# Patient Record
Sex: Female | Born: 1995 | Race: White | Hispanic: No | Marital: Single | Smoking: Never smoker
Health system: Southern US, Community
[De-identification: ages and names within clinical notes are randomized; demographics above are authoritative.]

## PROBLEM LIST (undated history)

## (undated) ENCOUNTER — Inpatient Hospital Stay (HOSPITAL_COMMUNITY): Payer: Self-pay

## (undated) ENCOUNTER — Inpatient Hospital Stay: Payer: Self-pay

## (undated) DIAGNOSIS — O139 Gestational [pregnancy-induced] hypertension without significant proteinuria, unspecified trimester: Secondary | ICD-10-CM

## (undated) DIAGNOSIS — J45909 Unspecified asthma, uncomplicated: Secondary | ICD-10-CM

## (undated) HISTORY — DX: Gestational (pregnancy-induced) hypertension without significant proteinuria, unspecified trimester: O13.9

## (undated) HISTORY — PX: JOINT REPLACEMENT: SHX530

---
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
## 2016-03-14 NOTE — L&D Delivery Note (Signed)
Patient is a 21 y.o. now G2P2 s/p NSVD at 6025w1d, who was admitted for IOL for gHTN.  Delivery Note At 12:18 PM a viable female was delivered via Vaginal, Spontaneous Delivery (Presentation: Cephalic ; ROA ).  APGAR: 8, 9; weight 7 lb 10.2 oz (3464 g).   Placenta status: intact, .  Cord: 3 vessel  with the following complications:none .    Anesthesia: epidural  Episiotomy: None Lacerations: None Suture Repair:  Est. Blood Loss (mL): 100  Mom to postpartum.  Baby to Couplet care / Skin to Skin.  Head delivered OA. No nuchal cord present. Shoulder and body delivered in usual fashion. Infant with spontaneous cry, placed on mother's abdomen, dried and bulb suctioned. Cord clamped x 2 after 1-minute delay, and cut by family member. Cord blood drawn. Placenta delivered spontaneously with gentle cord traction. Fundus firm with massage and Pitocin. Perineum inspected and found to have no laceration. Teodoro KilKerrriann S. Caylor Cerino,  MD Family Medicine Resident PGY-1 01/12/17, 3:09 PM

## 2016-03-29 ENCOUNTER — Encounter: Payer: Self-pay | Admitting: Emergency Medicine

## 2016-03-29 ENCOUNTER — Emergency Department
Admission: EM | Admit: 2016-03-29 | Discharge: 2016-03-29 | Disposition: A | Discharge status: Home / Self Care | Payer: Medicaid Other | Attending: Emergency Medicine | Admitting: Emergency Medicine

## 2016-03-29 DIAGNOSIS — Y939 Activity, unspecified: Secondary | ICD-10-CM | POA: Insufficient documentation

## 2016-03-29 DIAGNOSIS — J45909 Unspecified asthma, uncomplicated: Secondary | ICD-10-CM | POA: Insufficient documentation

## 2016-03-29 DIAGNOSIS — Z5321 Procedure and treatment not carried out due to patient leaving prior to being seen by health care provider: Secondary | ICD-10-CM | POA: Insufficient documentation

## 2016-03-29 DIAGNOSIS — Y929 Unspecified place or not applicable: Secondary | ICD-10-CM | POA: Diagnosis not present

## 2016-03-29 DIAGNOSIS — N939 Abnormal uterine and vaginal bleeding, unspecified: Secondary | ICD-10-CM | POA: Insufficient documentation

## 2016-03-29 DIAGNOSIS — Y999 Unspecified external cause status: Secondary | ICD-10-CM | POA: Insufficient documentation

## 2016-03-29 LAB — COMPREHENSIVE METABOLIC PANEL
ALT: 22 U/L (ref 14–54)
AST: 24 U/L (ref 15–41)
Albumin: 3.9 g/dL (ref 3.5–5.0)
Alkaline Phosphatase: 61 U/L (ref 38–126)
Anion gap: 5 (ref 5–15)
BUN: 11 mg/dL (ref 6–20)
CALCIUM: 9.4 mg/dL (ref 8.9–10.3)
CO2: 26 mmol/L (ref 22–32)
CREATININE: 0.52 mg/dL (ref 0.44–1.00)
Chloride: 105 mmol/L (ref 101–111)
GLUCOSE: 85 mg/dL (ref 65–99)
Potassium: 4.3 mmol/L (ref 3.5–5.1)
SODIUM: 136 mmol/L (ref 135–145)
TOTAL PROTEIN: 7.7 g/dL (ref 6.5–8.1)
Total Bilirubin: 0.3 mg/dL (ref 0.3–1.2)

## 2016-03-29 LAB — CBC
HCT: 38.8 % (ref 35.0–47.0)
Hemoglobin: 13.3 g/dL (ref 12.0–16.0)
MCH: 30.2 pg (ref 26.0–34.0)
MCHC: 34.4 g/dL (ref 32.0–36.0)
MCV: 87.8 fL (ref 80.0–100.0)
PLATELETS: 317 10*3/uL (ref 150–440)
RBC: 4.42 MIL/uL (ref 3.80–5.20)
RDW: 13.2 % (ref 11.5–14.5)
WBC: 9.8 10*3/uL (ref 3.6–11.0)

## 2016-03-29 NOTE — ED Notes (Signed)
Pt not in lobby when called to room.  
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
## 2016-05-04 DIAGNOSIS — E7800 Pure hypercholesterolemia, unspecified: Secondary | ICD-10-CM | POA: Insufficient documentation

## 2016-05-04 DIAGNOSIS — E669 Obesity, unspecified: Secondary | ICD-10-CM | POA: Insufficient documentation

## 2016-06-04 ENCOUNTER — Emergency Department
Admission: EM | Admit: 2016-06-04 | Discharge: 2016-06-05 | Disposition: A | Discharge status: Home / Self Care | Payer: Medicaid Other | Attending: Emergency Medicine | Admitting: Emergency Medicine

## 2016-06-04 ENCOUNTER — Emergency Department: Payer: Medicaid Other

## 2016-06-04 DIAGNOSIS — O200 Threatened abortion: Secondary | ICD-10-CM | POA: Diagnosis not present

## 2016-06-04 DIAGNOSIS — J45909 Unspecified asthma, uncomplicated: Secondary | ICD-10-CM | POA: Insufficient documentation

## 2016-06-04 DIAGNOSIS — O209 Hemorrhage in early pregnancy, unspecified: Secondary | ICD-10-CM | POA: Diagnosis present

## 2016-06-04 DIAGNOSIS — Z3A01 Less than 8 weeks gestation of pregnancy: Secondary | ICD-10-CM | POA: Diagnosis not present

## 2016-06-04 DIAGNOSIS — Z79899 Other long term (current) drug therapy: Secondary | ICD-10-CM | POA: Insufficient documentation

## 2016-06-04 LAB — BASIC METABOLIC PANEL
ANION GAP: 6 (ref 5–15)
BUN: 10 mg/dL (ref 6–20)
CALCIUM: 9.1 mg/dL (ref 8.9–10.3)
CHLORIDE: 101 mmol/L (ref 101–111)
CO2: 25 mmol/L (ref 22–32)
Creatinine, Ser: 0.57 mg/dL (ref 0.44–1.00)
GFR calc non Af Amer: >60 mL/min (ref >60)
GLUCOSE: 109 mg/dL — AB (ref 65–99)
Potassium: 3.8 mmol/L (ref 3.5–5.1)
Sodium: 132 mmol/L — ABNORMAL LOW (ref 135–145)

## 2016-06-04 LAB — HCG, QUANTITATIVE, PREGNANCY: HCG, BETA CHAIN, QUANT, S: 34122 m[IU]/mL — AB (ref <5)

## 2016-06-04 LAB — CBC
HCT: 37.7 % (ref 35.0–47.0)
HEMOGLOBIN: 13 g/dL (ref 12.0–16.0)
MCH: 30.3 pg (ref 26.0–34.0)
MCHC: 34.6 g/dL (ref 32.0–36.0)
MCV: 87.4 fL (ref 80.0–100.0)
Platelets: 321 10*3/uL (ref 150–440)
RBC: 4.31 MIL/uL (ref 3.80–5.20)
RDW: 13.1 % (ref 11.5–14.5)
WBC: 11.4 10*3/uL — ABNORMAL HIGH (ref 3.6–11.0)

## 2016-06-04 LAB — ABO/RH: ABO/RH(D): O POS

## 2016-06-04 LAB — POCT PREGNANCY, URINE: Preg Test, Ur: POSITIVE — AB

## 2016-06-04 NOTE — ED Notes (Signed)
In to see pt for assessment. Pt is in US at this time.

## 2016-06-04 NOTE — ED Notes (Signed)
Pt was given a gown and asked to remove under ware and pants for pelvic. Pt currently getting changed. RN notified of pt placement in room. Blood work and urine sample was obtained in Triage by this ED Tech. POCT RESULTS WERE *POSITIVE*

## 2016-06-04 NOTE — ED Provider Notes (Signed)
[facility] Emergency Department Provider Note   ____________________________________________   First MD Initiated Contact with Patient 06/04/16 2316     (approximate)  I have reviewed the triage vital signs and the nursing notes.   HISTORY  Chief Complaint Vaginal Bleeding (positive home pregnancy test)    HPI Breanna Carrillo is a 21 y.o. female G2 P1 approximately [redacted] weeks pregnant by dates who presents with vaginal bleeding and pelvic cramping. Reports positive pregnancy test at home. Reports vaginal spotting 2 days ago. Last sexual intercourse in February. Denies STD concerns. Denies fever, chills, chest pain, shortness of breath, nausea, vomiting, dysuria.Denies recent travel or trauma. Nothing makes her symptoms better or worse.   Past Medical History:  Diagnosis Date  . Asthma     There are no active problems to display for this patient.   Past Surgical History:  Procedure Laterality Date  . JOINT REPLACEMENT      Prior to Admission medications   Medication Sig Start Date End Date Taking? Authorizing Provider  cephALEXin (KEFLEX) 500 MG capsule Take 1 capsule (500 mg total) by mouth 2 (two) times daily. 10/20/15   Governor Rooksebecca Lord, MD  ibuprofen (ADVIL,MOTRIN) 800 MG tablet Take 1 tablet (800 mg total) by mouth every 8 (eight) hours as needed. 10/20/15   Governor Rooksebecca Lord, MD  ondansetron (ZOFRAN) 4 MG tablet Take 1 tablet (4 mg total) by mouth every 8 (eight) hours as needed for nausea or vomiting. 10/20/15   Governor Rooksebecca Lord, MD    Allergies Patient has no known allergies.  Family History  Problem Relation Age of Onset  . Hypertension Father     Social History Social History  Substance Use Topics  . Smoking status: Never Smoker  . Smokeless tobacco: Never Used  . Alcohol use No    Review of Systems  Constitutional: No fever/chills. Eyes: No visual changes. ENT: No sore throat. Cardiovascular: Denies chest pain. Respiratory: Denies  shortness of breath. Gastrointestinal: Positive for pelvic cramping. No abdominal pain.  No nausea, no vomiting.  No diarrhea.  No constipation. Genitourinary: Positive for vaginal bleeding. Negative for dysuria. Musculoskeletal: Negative for back pain. Skin: Negative for rash. Neurological: Negative for headaches, focal weakness or numbness.  10-point ROS otherwise negative.  ____________________________________________   PHYSICAL EXAM:  VITAL SIGNS: ED Triage Vitals  Enc Vitals Group     BP 06/04/16 2158 135/67     Pulse Rate 06/04/16 2158 (!) 105     Resp 06/04/16 2158 19     Temp 06/04/16 2158 97.8 F (36.6 C)     Temp Source 06/04/16 2158 Oral     SpO2 06/04/16 2158 99 %     Weight 06/04/16 2159 180 lb (81.6 kg)     Height 06/04/16 2159 5\' 7"  (1.702 m)     Head Circumference --      Peak Flow --      Pain Score 06/04/16 2159 7     Pain Loc --      Pain Edu? --      Excl. in GC? --     Constitutional: Alert and oriented. Well appearing and in no acute distress. Eyes: Conjunctivae are normal. PERRL. EOMI. Head: Atraumatic. Nose: No congestion/rhinnorhea. Mouth/Throat: Mucous membranes are moist.  Oropharynx non-erythematous. Neck: No stridor.   Cardiovascular: Normal rate, regular rhythm. Grossly normal heart sounds.  Good peripheral circulation. Respiratory: Normal respiratory effort.  No retractions. Lungs CTAB. Gastrointestinal: Soft and nontender to light or deep palpation. No  distention. No abdominal bruits. No CVA tenderness. Musculoskeletal: No lower extremity tenderness nor edema.  No joint effusions. Neurologic:  Normal speech and language. No gross focal neurologic deficits are appreciated. No gait instability. Skin:  Skin is warm, dry and intact. No rash noted. Psychiatric: Mood and affect are normal. Speech and behavior are normal.  ____________________________________________   LABS (all labs ordered are listed, but only abnormal results are  displayed)  Labs Reviewed  HCG, QUANTITATIVE, PREGNANCY - Abnormal; Notable for the following:       Result Value   hCG, Beta Chain, Quant, S 34,122 (*)    All other components within normal limits  CBC - Abnormal; Notable for the following:    WBC 11.4 (*)    All other components within normal limits  BASIC METABOLIC PANEL - Abnormal; Notable for the following:    Sodium 132 (*)    Glucose, Bld 109 (*)    All other components within normal limits  POCT PREGNANCY, URINE - Abnormal; Notable for the following:    Preg Test, Ur POSITIVE (*)    All other components within normal limits  POC URINE PREG, ED  ABO/RH   ____________________________________________  EKG  None ____________________________________________  RADIOLOGY  OB ultrasound interpreted per Dr. Manson Passey: 1. Single living intrauterine embryo.  2. Size by ultrasound correlates well with dating by LMP.  3. Today's exam confirms clinical EDC of 02/01/2017.   ____________________________________________   PROCEDURES  Procedure(s) performed:   Pelvic exam: External exam WNL without rashes, lesions or vesicles. Speculum exam reveals no vaginal bleeding, cervical os closed. Bimanual exam WNL.  :Procedures  Critical Care performed: No  ____________________________________________   INITIAL IMPRESSION / ASSESSMENT AND PLAN / ED COURSE  Pertinent labs & imaging results that were available during my care of the patient were reviewed by me and considered in my medical decision making (see chart for details).  21 year old female G2 P1 approximately [redacted] weeks pregnant without vaginal bleeding on exam. Ultrasound reveals IUP. Patient delivered with [facility] with her first child and wishes to return for prenatal care. Strict return precautions given. Patient verbalizes understanding and agrees with plan of care.      ____________________________________________   FINAL CLINICAL IMPRESSION(S) / ED  DIAGNOSES  Final diagnoses:  Threatened miscarriage      NEW MEDICATIONS STARTED DURING THIS VISIT:  New Prescriptions   No medications on file     Note:  This document was prepared using Dragon voice recognition software and may include unintentional dictation errors.    Irean Hong, MD 06/05/16 [phone]

## 2016-06-04 NOTE — ED Triage Notes (Signed)
Patient c/o vaginal bleeding, abdominal cramping beginning today. Patient reports she had a positive pregnancy test at home. Pt reports last menstrual period 04/27/16.

## 2016-06-05 NOTE — Discharge Instructions (Signed)
1. Drink plenty of fluids daily. °2. Pelvic rest until seen by your doctor - no sexual intercourse, douche, tampons. °3. Return to the ER for worsening symptoms, persistent vomiting, fainting, soaking more than 1 pad per hour or other concerns.  °

## 2016-06-08 LAB — PREGNANCY, URINE: Preg Test, Ur: POSITIVE

## 2016-06-21 ENCOUNTER — Telehealth: Payer: Self-pay | Admitting: Obstetrics & Gynecology

## 2016-06-21 NOTE — Telephone Encounter (Signed)
Called and Lvm With [facility] referral Line that at this time we are on hold for new Medicaid Pt.

## 2016-06-22 ENCOUNTER — Emergency Department
Admission: EM | Admit: 2016-06-22 | Discharge: 2016-06-22 | Disposition: A | Discharge status: Home / Self Care | Payer: Medicaid Other | Attending: Emergency Medicine | Admitting: Emergency Medicine

## 2016-06-22 ENCOUNTER — Emergency Department: Payer: Medicaid Other

## 2016-06-22 DIAGNOSIS — J45909 Unspecified asthma, uncomplicated: Secondary | ICD-10-CM | POA: Insufficient documentation

## 2016-06-22 DIAGNOSIS — R1032 Left lower quadrant pain: Secondary | ICD-10-CM | POA: Insufficient documentation

## 2016-06-22 DIAGNOSIS — O219 Vomiting of pregnancy, unspecified: Secondary | ICD-10-CM | POA: Diagnosis not present

## 2016-06-22 DIAGNOSIS — O26891 Other specified pregnancy related conditions, first trimester: Secondary | ICD-10-CM | POA: Diagnosis present

## 2016-06-22 DIAGNOSIS — Z3A08 8 weeks gestation of pregnancy: Secondary | ICD-10-CM | POA: Insufficient documentation

## 2016-06-22 DIAGNOSIS — O0281 Inappropriate change in quantitative human chorionic gonadotropin (hCG) in early pregnancy: Secondary | ICD-10-CM | POA: Diagnosis not present

## 2016-06-22 LAB — URINALYSIS, COMPLETE (UACMP) WITH MICROSCOPIC
Bilirubin Urine: NEGATIVE
Glucose, UA: NEGATIVE mg/dL
HGB URINE DIPSTICK: NEGATIVE
Ketones, ur: NEGATIVE mg/dL
Nitrite: NEGATIVE
PROTEIN: NEGATIVE mg/dL
SPECIFIC GRAVITY, URINE: 1.024 (ref 1.005–1.030)
pH: 6 (ref 5.0–8.0)

## 2016-06-22 LAB — COMPREHENSIVE METABOLIC PANEL
ALBUMIN: 3.5 g/dL (ref 3.5–5.0)
ALK PHOS: 78 U/L (ref 38–126)
ALT: 14 U/L (ref 14–54)
ANION GAP: 6 (ref 5–15)
AST: 20 U/L (ref 15–41)
BILIRUBIN TOTAL: 0.4 mg/dL (ref 0.3–1.2)
BUN: 7 mg/dL (ref 6–20)
CO2: 25 mmol/L (ref 22–32)
Calcium: 8.9 mg/dL (ref 8.9–10.3)
Chloride: 102 mmol/L (ref 101–111)
Creatinine, Ser: 0.54 mg/dL (ref 0.44–1.00)
GFR calc Af Amer: >60 mL/min (ref >60)
GFR calc non Af Amer: >60 mL/min (ref >60)
GLUCOSE: 107 mg/dL — AB (ref 65–99)
POTASSIUM: 3.8 mmol/L (ref 3.5–5.1)
Sodium: 133 mmol/L — ABNORMAL LOW (ref 135–145)
Total Protein: 7.3 g/dL (ref 6.5–8.1)

## 2016-06-22 LAB — LIPASE, BLOOD: Lipase: 24 U/L (ref 11–51)

## 2016-06-22 LAB — CBC
HEMATOCRIT: 38.3 % (ref 35.0–47.0)
HEMOGLOBIN: 13.4 g/dL (ref 12.0–16.0)
MCH: 31 pg (ref 26.0–34.0)
MCHC: 35 g/dL (ref 32.0–36.0)
MCV: 88.4 fL (ref 80.0–100.0)
Platelets: 304 10*3/uL (ref 150–440)
RBC: 4.34 MIL/uL (ref 3.80–5.20)
RDW: 13 % (ref 11.5–14.5)
WBC: 8.7 10*3/uL (ref 3.6–11.0)

## 2016-06-22 LAB — HCG, QUANTITATIVE, PREGNANCY: HCG, BETA CHAIN, QUANT, S: 134246 m[IU]/mL — AB (ref <5)

## 2016-06-22 MED ORDER — SODIUM CHLORIDE 0.9 % IV BOLUS (SEPSIS)
1000.0000 mL | Freq: Once | INTRAVENOUS | Status: AC
  Administered 2016-06-22: 1000 mL via INTRAVENOUS

## 2016-06-22 MED ORDER — METOCLOPRAMIDE HCL 10 MG PO TABS: 10.0000 mg | ORAL_TABLET | Freq: Four times a day (QID) | ORAL | 0 refills | Status: DC | PRN

## 2016-06-22 MED ORDER — FAMOTIDINE 20 MG PO TABS: 20.0000 mg | ORAL_TABLET | Freq: Two times a day (BID) | ORAL | 0 refills | Status: DC

## 2016-06-22 MED ORDER — METOCLOPRAMIDE HCL 5 MG/ML IJ SOLN
10.0000 mg | Freq: Once | INTRAMUSCULAR | Status: AC
Start: 2016-06-22 — End: 2016-06-22
  Administered 2016-06-22: 10 mg via INTRAVENOUS
  Filled 2016-06-22: qty 2

## 2016-06-22 MED ORDER — DIPHENHYDRAMINE HCL 50 MG/ML IJ SOLN
25.0000 mg | Freq: Once | INTRAMUSCULAR | Status: AC
Start: 2016-06-22 — End: 2016-06-22
  Administered 2016-06-22: 25 mg via INTRAVENOUS
  Filled 2016-06-22: qty 1

## 2016-06-22 NOTE — ED Notes (Signed)
Pt given crackers, peanut butter and sprite. Visitor given sprite as well.

## 2016-06-22 NOTE — ED Provider Notes (Signed)
[facility] Emergency Department Provider Note  ____________________________________________  Time seen: Approximately 9:11 PM  I have reviewed the triage vital signs and the nursing notes.   HISTORY  Chief Complaint Abdominal Pain and Dizziness    HPI Breanna Carrillo is a 21 y.o. female who complains of dizziness as well as left lower quadrant abdominal pain for the past several days. She is about [redacted] weeks pregnant, recently had an ultrasound about 2-3 weeks ago which revealed a IUP. Denies any vaginal bleeding but does have cramping pain in the left lower quadrant which is nonradiating and without any aggravating or alleviating factors.  Plans to follow up with [facility] in [place] for prenatal care but has not yet had an appointment. States she is taking prenatal vitamins.  No dysuria frequency urgency or hematuria   Past Medical History:  Diagnosis Date  . Asthma      There are no active problems to display for this patient.    Past Surgical History:  Procedure Laterality Date  . JOINT REPLACEMENT       Prior to Admission medications   Medication Sig Start Date End Date Taking? Authorizing Provider  cephALEXin (KEFLEX) 500 MG capsule Take 1 capsule (500 mg total) by mouth 2 (two) times daily. 10/20/15   Governor Rooks, MD  famotidine (PEPCID) 20 MG tablet Take 1 tablet (20 mg total) by mouth 2 (two) times daily. 06/22/16   Sharman Cheek, MD  ibuprofen (ADVIL,MOTRIN) 800 MG tablet Take 1 tablet (800 mg total) by mouth every 8 (eight) hours as needed. 10/20/15   Governor Rooks, MD  metoCLOPramide (REGLAN) 10 MG tablet Take 1 tablet (10 mg total) by mouth every 6 (six) hours as needed. 06/22/16   Sharman Cheek, MD  ondansetron (ZOFRAN) 4 MG tablet Take 1 tablet (4 mg total) by mouth every 8 (eight) hours as needed for nausea or vomiting. 10/20/15   Governor Rooks, MD     Allergies Patient has no known allergies.   Family History  Problem  Relation Age of Onset  . Hypertension Father     Social History Social History  Substance Use Topics  . Smoking status: Never Smoker  . Smokeless tobacco: Never Used  . Alcohol use No    Review of Systems  Constitutional:   No fever or chills.  ENT:   No sore throat. No rhinorrhea. Cardiovascular:   No chest pain. Respiratory:   No dyspnea or cough. Gastrointestinal:   Positive as above for abdominal pain without, vomiting and diarrhea.  Genitourinary:   Negative for dysuria or difficulty urinating. Musculoskeletal:   Negative for focal pain or swelling Neurological:   Negative for headaches 10-point ROS otherwise negative.  ____________________________________________   PHYSICAL EXAM:  VITAL SIGNS: ED Triage Vitals  Enc Vitals Group     BP 06/22/16 1535 118/67     Pulse Rate 06/22/16 1535 94     Resp 06/22/16 1535 18     Temp 06/22/16 1535 97.7 F (36.5 C)     Temp Source 06/22/16 1535 Oral     SpO2 06/22/16 1535 100 %     Weight --      Height --      Head Circumference --      Peak Flow --      Pain Score 06/22/16 1537 8     Pain Loc --      Pain Edu? --      Excl. in GC? --  Vital signs reviewed, nursing assessments reviewed.   Constitutional:   Alert and oriented. Well appearing and in no distress. Eyes:   No scleral icterus. No conjunctival pallor. PERRL. EOMI.  No nystagmus. ENT   Head:   Normocephalic and atraumatic.   Nose:   No congestion/rhinnorhea. No septal hematoma   Mouth/Throat:   MMM, no pharyngeal erythema. No peritonsillar mass.    Neck:   No stridor. No SubQ emphysema. No meningismus. Hematological/Lymphatic/Immunilogical:   No cervical lymphadenopathy. Cardiovascular:   RRR. Symmetric bilateral radial and DP pulses.  No murmurs.  Respiratory:   Normal respiratory effort without tachypnea nor retractions. Breath sounds are clear and equal bilaterally. No wheezes/rales/rhonchi. Gastrointestinal:   Soft With left lower  quadrant tenderness. Non distended. There is no CVA tenderness.  No rebound, rigidity, or guarding. Genitourinary:   deferred Musculoskeletal:   Normal range of motion in all extremities. No joint effusions.  No lower extremity tenderness.  No edema. Neurologic:   Normal speech and language.  CN 2-10 normal. Motor grossly intact. No gross focal neurologic deficits are appreciated.  Skin:    Skin is warm, dry and intact. No rash noted.  No petechiae, purpura, or bullae.  ____________________________________________    LABS (pertinent positives/negatives) (all labs ordered are listed, but only abnormal results are displayed) Labs Reviewed  COMPREHENSIVE METABOLIC PANEL - Abnormal; Notable for the following:       Result Value   Sodium 133 (*)    Glucose, Bld 107 (*)    All other components within normal limits  URINALYSIS, COMPLETE (UACMP) WITH MICROSCOPIC - Abnormal; Notable for the following:    Color, Urine YELLOW (*)    APPearance CLOUDY (*)    Leukocytes, UA LARGE (*)    Bacteria, UA RARE (*)    Squamous Epithelial / LPF TOO NUMEROUS TO COUNT (*)    All other components within normal limits  HCG, QUANTITATIVE, PREGNANCY - Abnormal; Notable for the following:    hCG, Beta Chain, Quant, S 134,246 (*)    All other components within normal limits  URINE CULTURE  LIPASE, BLOOD  CBC   ____________________________________________   EKG    ____________________________________________    RADIOLOGY  US Ob Comp Less 14 Wks  Result Date: 06/22/2016 CLINICAL DATA:  Initial evaluation for acute left lower quadrant abdominal pain for 5 days. Currently pregnant. Beta HCG not available at time of exam interpretation. EXAM: OBSTETRIC <14 WK [place] AND TRANSVAGINAL OB US DOPPLER ULTRASOUND OF OVARIES TECHNIQUE: Both transabdominal and transvaginal ultrasound examinations were performed for complete evaluation of the gestation as well as the maternal uterus, adnexal regions, and pelvic  cul-de-sac. Transvaginal technique was performed to assess early pregnancy. Color and duplex Doppler ultrasound was utilized to evaluate blood flow to the ovaries. COMPARISON:  Prior ultrasound from 06/04/2013. FINDINGS: Intrauterine gestational sac: Single Yolk sac:  Present Embryo:  Present Cardiac Activity: Present Heart Rate: 180 bpm CRL:   19.3 mm   8 w 3 d                  [place] EDC: 01/29/2017 Subchorionic hemorrhage: Small subchorionic hemorrhage measuring 1.0 x 1.1 x 1.0 cm without mass effect. Maternal uterus/adnexae: The ovaries are well-visualized in the adnexa bilaterally, both normal in appearance. Small approximate 1 cm left corpus luteum cyst noted. No free fluid. Pulsed Doppler evaluation of both ovaries demonstrates normal appearing low-resistance arterial and venous waveforms. IMPRESSION: 1. Single live intrauterine embryo as above. Size and appearance consistent with  expected interval growth as compared to prior ultrasound from 06/04/2016. 2. Small subchorionic hemorrhage without mass effect. 3. No evidence for ovarian torsion. 4. Small approximate 1 cm left corpus luteum cyst.  No free fluid. Electronically Signed   By: Rise Mu M.D.   On: 06/22/2016 20:30   US Ob Transvaginal  Result Date: 06/22/2016 CLINICAL DATA:  Initial evaluation for acute left lower quadrant abdominal pain for 5 days. Currently pregnant. Beta HCG not available at time of exam interpretation. EXAM: OBSTETRIC <14 WK [place] AND TRANSVAGINAL OB US DOPPLER ULTRASOUND OF OVARIES TECHNIQUE: Both transabdominal and transvaginal ultrasound examinations were performed for complete evaluation of the gestation as well as the maternal uterus, adnexal regions, and pelvic cul-de-sac. Transvaginal technique was performed to assess early pregnancy. Color and duplex Doppler ultrasound was utilized to evaluate blood flow to the ovaries. COMPARISON:  Prior ultrasound from 06/04/2013. FINDINGS: Intrauterine gestational sac: Single  Yolk sac:  Present Embryo:  Present Cardiac Activity: Present Heart Rate: 180 bpm CRL:   19.3 mm   8 w 3 d                  [place] EDC: 01/29/2017 Subchorionic hemorrhage: Small subchorionic hemorrhage measuring 1.0 x 1.1 x 1.0 cm without mass effect. Maternal uterus/adnexae: The ovaries are well-visualized in the adnexa bilaterally, both normal in appearance. Small approximate 1 cm left corpus luteum cyst noted. No free fluid. Pulsed Doppler evaluation of both ovaries demonstrates normal appearing low-resistance arterial and venous waveforms. IMPRESSION: 1. Single live intrauterine embryo as above. Size and appearance consistent with expected interval growth as compared to prior ultrasound from 06/04/2016. 2. Small subchorionic hemorrhage without mass effect. 3. No evidence for ovarian torsion. 4. Small approximate 1 cm left corpus luteum cyst.  No free fluid. Electronically Signed   By: Rise Mu M.D.   On: 06/22/2016 20:30   [place] Art/ven Flow Abd Pelv Doppler  Result Date: 06/22/2016 CLINICAL DATA:  Initial evaluation for acute left lower quadrant abdominal pain for 5 days. Currently pregnant. Beta HCG not available at time of exam interpretation. EXAM: OBSTETRIC <14 WK [place] AND TRANSVAGINAL OB US DOPPLER ULTRASOUND OF OVARIES TECHNIQUE: Both transabdominal and transvaginal ultrasound examinations were performed for complete evaluation of the gestation as well as the maternal uterus, adnexal regions, and pelvic cul-de-sac. Transvaginal technique was performed to assess early pregnancy. Color and duplex Doppler ultrasound was utilized to evaluate blood flow to the ovaries. COMPARISON:  Prior ultrasound from 06/04/2013. FINDINGS: Intrauterine gestational sac: Single Yolk sac:  Present Embryo:  Present Cardiac Activity: Present Heart Rate: 180 bpm CRL:   19.3 mm   8 w 3 d                  [place] EDC: 01/29/2017 Subchorionic hemorrhage: Small subchorionic hemorrhage measuring 1.0 x 1.1 x 1.0 cm without mass  effect. Maternal uterus/adnexae: The ovaries are well-visualized in the adnexa bilaterally, both normal in appearance. Small approximate 1 cm left corpus luteum cyst noted. No free fluid. Pulsed Doppler evaluation of both ovaries demonstrates normal appearing low-resistance arterial and venous waveforms. IMPRESSION: 1. Single live intrauterine embryo as above. Size and appearance consistent with expected interval growth as compared to prior ultrasound from 06/04/2016. 2. Small subchorionic hemorrhage without mass effect. 3. No evidence for ovarian torsion. 4. Small approximate 1 cm left corpus luteum cyst.  No free fluid. Electronically Signed   By: Rise Mu M.D.   On: 06/22/2016 20:30    ____________________________________________  PROCEDURES Procedures  ____________________________________________   INITIAL IMPRESSION / ASSESSMENT AND PLAN / ED COURSE  Pertinent labs & imaging results that were available during my care of the patient were reviewed by me and considered in my medical decision making (see chart for details).  Patient presents with abdominal pain nausea and oral intolerance in setting of about [redacted] weeks pregnant. Previous ultrasound showed a 2 cm corpus luteum cyst on the left ovary, raising some concern for torsion although I don't think this is highly likely. Ultrasound performed which had reassuring waveform on Doppler. Again confirms a live IUP today with a small subchorionic hemorrhage. Patient will continue her plan to follow up for prenatal care. Very low suspicion of a heterotopic pregnancy, torsion, PID STI TOA. No evidence of perforation or obstruction.         ____________________________________________   FINAL CLINICAL IMPRESSION(S) / ED DIAGNOSES  Final diagnoses:  Acute left lower quadrant pain  Nausea and vomiting of pregnancy, antepartum      New Prescriptions   FAMOTIDINE (PEPCID) 20 MG TABLET    Take 1 tablet (20 mg total) by mouth 2  (two) times daily.   METOCLOPRAMIDE (REGLAN) 10 MG TABLET    Take 1 tablet (10 mg total) by mouth every 6 (six) hours as needed.     Portions of this note were generated with dragon dictation software. Dictation errors may occur despite best attempts at proofreading.    Sharman Cheek, MD 06/22/16 2114

## 2016-06-22 NOTE — ED Triage Notes (Signed)
Pt states she took sudafed yesterday for congestion because the pharmacist told her she could with pregnancy. States she woke with dizziness this morning . Also states she has not been able to keep much down over the past 4 days. Also having LLQ pain which she has had for a while and was dx with ovarian cyst..

## 2016-06-22 NOTE — ED Notes (Signed)
Pt returned from U/S via stretcher. 

## 2016-06-24 LAB — URINE CULTURE

## 2016-07-15 ENCOUNTER — Encounter: Payer: Self-pay | Admitting: *Deleted

## 2016-07-25 DIAGNOSIS — O210 Mild hyperemesis gravidarum: Secondary | ICD-10-CM

## 2016-07-27 ENCOUNTER — Encounter: Payer: Self-pay | Admitting: Advanced Practice Midwife

## 2016-07-27 ENCOUNTER — Other Ambulatory Visit (HOSPITAL_COMMUNITY)
Admission: RE | Admit: 2016-07-27 | Discharge: 2016-07-27 | Disposition: A | Discharge status: Home / Self Care | Payer: Medicaid Other | Source: Ambulatory Visit | Attending: Advanced Practice Midwife | Admitting: Advanced Practice Midwife

## 2016-07-27 ENCOUNTER — Ambulatory Visit (INDEPENDENT_AMBULATORY_CARE_PROVIDER_SITE_OTHER): Payer: Medicaid Other | Admitting: Advanced Practice Midwife

## 2016-07-27 VITALS — BP 112/57 | HR 91 | Wt 219.5 lb

## 2016-07-27 DIAGNOSIS — O09291 Supervision of pregnancy with other poor reproductive or obstetric history, first trimester: Secondary | ICD-10-CM | POA: Diagnosis not present

## 2016-07-27 DIAGNOSIS — Z3A13 13 weeks gestation of pregnancy: Secondary | ICD-10-CM

## 2016-07-27 DIAGNOSIS — Z113 Encounter for screening for infections with a predominantly sexual mode of transmission: Secondary | ICD-10-CM

## 2016-07-27 DIAGNOSIS — Z3481 Encounter for supervision of other normal pregnancy, first trimester: Secondary | ICD-10-CM | POA: Diagnosis not present

## 2016-07-27 DIAGNOSIS — Z3A18 18 weeks gestation of pregnancy: Secondary | ICD-10-CM

## 2016-07-27 DIAGNOSIS — O4692 Antepartum hemorrhage, unspecified, second trimester: Secondary | ICD-10-CM

## 2016-07-27 DIAGNOSIS — O4691 Antepartum hemorrhage, unspecified, first trimester: Secondary | ICD-10-CM

## 2016-07-27 DIAGNOSIS — O219 Vomiting of pregnancy, unspecified: Secondary | ICD-10-CM

## 2016-07-27 DIAGNOSIS — O0990 Supervision of high risk pregnancy, unspecified, unspecified trimester: Secondary | ICD-10-CM | POA: Insufficient documentation

## 2016-07-27 DIAGNOSIS — Z3482 Encounter for supervision of other normal pregnancy, second trimester: Secondary | ICD-10-CM | POA: Insufficient documentation

## 2016-07-27 DIAGNOSIS — O09292 Supervision of pregnancy with other poor reproductive or obstetric history, second trimester: Secondary | ICD-10-CM

## 2016-07-27 LAB — POCT URINALYSIS DIP (DEVICE)
Bilirubin Urine: NEGATIVE
Glucose, UA: NEGATIVE mg/dL
HGB URINE DIPSTICK: NEGATIVE
Ketones, ur: 40 mg/dL — AB
Nitrite: NEGATIVE
PROTEIN: NEGATIVE mg/dL
SPECIFIC GRAVITY, URINE: 1.02 (ref 1.005–1.030)
UROBILINOGEN UA: 1 mg/dL (ref 0.0–1.0)
pH: 7 (ref 5.0–8.0)

## 2016-07-27 MED ORDER — ASPIRIN EC 81 MG PO TBEC: 81.0000 mg | DELAYED_RELEASE_TABLET | Freq: Every day | ORAL | 8 refills | Status: DC

## 2016-07-27 MED ORDER — ONDANSETRON 4 MG PO TBDP: 4.0000 mg | ORAL_TABLET | Freq: Four times a day (QID) | ORAL | 2 refills | Status: DC | PRN

## 2016-07-27 NOTE — Progress Notes (Signed)
Patient has had heavy bleeding in the last 2 weeks, spotting over the weekend, no bleeding now Breastfeeding tip reviewed Initial prenatal education packet Declines flu vaccine

## 2016-07-27 NOTE — Patient Instructions (Addendum)
Second Trimester of Pregnancy The second trimester is from week 14 through week 27 (months 4 through 6). The second trimester is often a time when you feel your best. Your body has adjusted to being pregnant, and you begin to feel better physically. Usually, morning sickness has lessened or quit completely, you may have more energy, and you may have an increase in appetite. The second trimester is also a time when the fetus is growing rapidly. At the end of the sixth month, the fetus is about 9 inches long and weighs about 1 pounds. You will likely begin to feel the baby move (quickening) between 16 and 20 weeks of pregnancy. Body changes during your second trimester Your body continues to go through many changes during your second trimester. The changes vary from woman to woman.  Your weight will continue to increase. You will notice your lower abdomen bulging out.  You may begin to get stretch marks on your hips, abdomen, and breasts.  You may develop headaches that can be relieved by medicines. The medicines should be approved by your health care provider.  You may urinate more often because the fetus is pressing on your bladder.  You may develop or continue to have heartburn as a result of your pregnancy.  You may develop constipation because certain hormones are causing the muscles that push waste through your intestines to slow down.  You may develop hemorrhoids or swollen, bulging veins (varicose veins).  You may have back pain. This is caused by:  Weight gain.  Pregnancy hormones that are relaxing the joints in your pelvis.  A shift in weight and the muscles that support your balance.  Your breasts will continue to grow and they will continue to become tender.  Your gums may bleed and may be sensitive to brushing and flossing.  Dark spots or blotches (chloasma, mask of pregnancy) may develop on your face. This will likely fade after the baby is born.  A dark line from your  belly button to the pubic area (linea nigra) may appear. This will likely fade after the baby is born.  You may have changes in your hair. These can include thickening of your hair, rapid growth, and changes in texture. Some women also have hair loss during or after pregnancy, or hair that feels dry or thin. Your hair will most likely return to normal after your baby is born. What to expect at prenatal visits During a routine prenatal visit:  You will be weighed to make sure you and the fetus are growing normally.  Your blood pressure will be taken.  Your abdomen will be measured to track your baby's growth.  The fetal heartbeat will be listened to.  Any test results from the previous visit will be discussed. Your health care provider may ask you:  How you are feeling.  If you are feeling the baby move.  If you have had any abnormal symptoms, such as leaking fluid, bleeding, severe headaches, or abdominal cramping.  If you are using any tobacco products, including cigarettes, chewing tobacco, and electronic cigarettes.  If you have any questions. Other tests that may be performed during your second trimester include:  Blood tests that check for:  Low iron levels (anemia).  High blood sugar that affects pregnant women (gestational diabetes) between 24 and 28 weeks.  Rh antibodies. This is to check for a protein on red blood cells (Rh factor).  Urine tests to check for infections, diabetes, or protein in the   urine.  An ultrasound to confirm the proper growth and development of the baby.  An amniocentesis to check for possible genetic problems.  Fetal screens for spina bifida and Down syndrome.  HIV (human immunodeficiency virus) testing. Routine prenatal testing includes screening for HIV, unless you choose not to have this test. Follow these instructions at home: Medicines   Follow your health care provider's instructions regarding medicine use. Specific medicines may  be either safe or unsafe to take during pregnancy.  Take a prenatal vitamin that contains at least 600 micrograms (mcg) of folic acid.  If you develop constipation, try taking a stool softener if your health care provider approves. Eating and drinking   Eat a balanced diet that includes fresh fruits and vegetables, whole grains, good sources of protein such as meat, eggs, or tofu, and low-fat dairy. Your health care provider will help you determine the amount of weight gain that is right for you.  Avoid raw meat and uncooked cheese. These carry germs that can cause birth defects in the baby.  If you have low calcium intake from food, talk to your health care provider about whether you should take a daily calcium supplement.  Limit foods that are high in fat and processed sugars, such as fried and sweet foods.  To prevent constipation:  Drink enough fluid to keep your urine clear or pale yellow.  Eat foods that are high in fiber, such as fresh fruits and vegetables, whole grains, and beans. Activity   Exercise only as directed by your health care provider. Most women can continue their usual exercise routine during pregnancy. Try to exercise for 30 minutes at least 5 days a week. Stop exercising if you experience uterine contractions.  Avoid heavy lifting, wear low heel shoes, and practice good posture.  A sexual relationship may be continued unless your health care provider directs you otherwise. Relieving pain and discomfort   Wear a good support bra to prevent discomfort from breast tenderness.  Take warm sitz baths to soothe any pain or discomfort caused by hemorrhoids. Use hemorrhoid cream if your health care provider approves.  Rest with your legs elevated if you have leg cramps or low back pain.  If you develop varicose veins, wear support hose. Elevate your feet for 15 minutes, 3-4 times a day. Limit salt in your diet. Prenatal Care   Write down your questions. Take them  to your prenatal visits.  Keep all your prenatal visits as told by your health care provider. This is important. Safety   Wear your seat belt at all times when driving.  Make a list of emergency phone numbers, including numbers for family, friends, the hospital, and police and fire departments. General instructions   Ask your health care provider for a referral to a local prenatal education class. Begin classes no later than the beginning of month 6 of your pregnancy.  Ask for help if you have counseling or nutritional needs during pregnancy. Your health care provider can offer advice or refer you to specialists for help with various needs.  Do not use hot tubs, steam rooms, or saunas.  Do not douche or use tampons or scented sanitary pads.  Do not cross your legs for long periods of time.  Avoid cat litter boxes and soil used by cats. These carry germs that can cause birth defects in the baby and possibly loss of the fetus by miscarriage or stillbirth.  Avoid all smoking, herbs, alcohol, and unprescribed drugs. Chemicals in   these products can affect the formation and growth of the baby.  Do not use any products that contain nicotine or tobacco, such as cigarettes and e-cigarettes. If you need help quitting, ask your health care provider.  Visit your dentist if you have not gone yet during your pregnancy. Use a soft toothbrush to brush your teeth and be gentle when you floss. Contact a health care provider if:  You have dizziness.  You have mild pelvic cramps, pelvic pressure, or nagging pain in the abdominal area.  You have persistent nausea, vomiting, or diarrhea.  You have a bad smelling vaginal discharge.  You have pain when you urinate. Get help right away if:  You have a fever.  You are leaking fluid from your vagina.  You have spotting or bleeding from your vagina.  You have severe abdominal cramping or pain.  You have rapid weight gain or weight loss.  You  have shortness of breath with chest pain.  You notice sudden or extreme swelling of your face, hands, ankles, feet, or legs.  You have not felt your baby move in over an hour.  You have severe headaches that do not go away when you take medicine.  You have vision changes. Summary  The second trimester is from week 14 through week 27 (months 4 through 6). It is also a time when the fetus is growing rapidly.  Your body goes through many changes during pregnancy. The changes vary from woman to woman.  Avoid all smoking, herbs, alcohol, and unprescribed drugs. These chemicals affect the formation and growth your baby.  Do not use any tobacco products, such as cigarettes, chewing tobacco, and e-cigarettes. If you need help quitting, ask your health care provider.  Contact your health care provider if you have any questions. Keep all prenatal visits as told by your health care provider. This is important. This information is not intended to replace advice given to you by your health care provider. Make sure you discuss any questions you have with your health care provider. Document Released: 02/22/2001 Document Revised: 08/06/2015 Document Reviewed: 05/01/2012 Elsevier Interactive Patient Education  2017 [employer]   Subchorionic Hematoma A subchorionic hematoma is a gathering of blood between the outer wall of the placenta and the inner wall of the womb (uterus). The placenta is the organ that connects the fetus to the wall of the uterus. The placenta performs the feeding, breathing (oxygen to the fetus), and waste removal (excretory work) of the fetus. Subchorionic hematoma is the most common abnormality found on a result from ultrasonography done during the first trimester or early second trimester of pregnancy. If there has been little or no vaginal bleeding, early small hematomas usually shrink on their own and do not affect your baby or pregnancy. The blood is gradually absorbed over  1-2 weeks. When bleeding starts later in pregnancy or the hematoma is larger or occurs in an older pregnant woman, the outcome may not be as good. Larger hematomas may get bigger, which increases the chances for miscarriage. Subchorionic hematoma also increases the risk of premature detachment of the placenta from the uterus, preterm (premature) labor, and stillbirth. Follow these instructions at home: Avoid heavy lifting (more than 10 lb [4.5 kg]), exercise, sexual intercourse, or douching as directed by your health care provider. Keep track of the number of pads you use each day and how soaked (saturated) they are. Write down this information. Do not use tampons. Keep all follow-up appointments as directed by  your health care provider. Your health care provider may ask you to have follow-up blood tests or ultrasound tests or both. Get help right away if: You have severe cramps in your stomach, back, abdomen, or pelvis. You have a fever. You pass large clots or tissue. Save any tissue for your health care provider to look at. Your bleeding increases or you become lightheaded, feel weak, or have fainting episodes. This information is not intended to replace advice given to you by your health care provider. Make sure you discuss any questions you have with your health care provider. Document Released: 06/15/2006 Document Revised: 08/06/2015 Document Reviewed: 09/27/2012 [employer]vier Interactive Patient Education  2017 [employer]

## 2016-07-27 NOTE — Progress Notes (Signed)
PRENATAL VISIT NOTE  Subjective:  Breanna Carrillo is a 21 y.o. G2P1001 at 6w3dbeing seen today for initial prenatal visit.  She has hx of NSVD x 1 with   She is currently monitored for the following issues for this low-risk pregnancy and has Hyperemesis gravidarum and Supervision of normal pregnancy in first trimester on her problem list.  Patient reports vaginal bleeding intermittently x 3-4 weeks, none today and daily nausea with occasional vomiting.   . Vag. Bleeding: None.   . Denies leaking of fluid.   The following portions of the patient's history were reviewed and updated as appropriate: allergies, current medications, past family history, past medical history, past social history, past surgical history and problem list. Problem list updated.  Objective:   Vitals:   07/27/16 0924  BP: (!) 112/57  Pulse: 91  Weight: 219 lb 8 oz (99.6 kg)    Fetal Status: Fetal Heart Rate (bpm): 150         General:  Alert, oriented and cooperative. Patient is in no acute distress.  Skin: Skin is warm and dry. No rash noted.   Cardiovascular: Normal heart rate noted  Respiratory: Normal respiratory effort, no problems with respiration noted  Abdomen: Soft, gravid, appropriate for gestational age. Pain/Pressure: Present     Pelvic:  Cervical exam deferred        Extremities: Normal range of motion.     Mental Status: Normal mood and affect. Normal behavior. Normal judgment and thought content.   Pap not needed r/t age, Pelvic exam deferred  Assessment and Plan:  Pregnancy: G2P1001 at 150w3d 1. Encounter for supervision of other normal pregnancy in first trimester --Pt to return at 1568 weeksor Quad/AFP and early glucose testing - Obstetric Panel, Including HIV - Cystic Fibrosis Mutation 97 - Cervicovaginal ancillary only - Culture, OB Urine  2. Nausea and vomiting during pregnancy prior to [redacted] weeks gestation --Pt taking dramamine PRN but makes her sleepy.  Needs medication to  use occasionally at work. - ondansetron (ZOFRAN ODT) 4 MG disintegrating tablet; Take 1 tablet (4 mg total) by mouth every 6 (six) hours as needed for nausea.  Dispense: 20 tablet; Refill: 2  3. Vaginal bleeding in pregnancy, second trimester --Pt reports bleeding off and on since early pregnancy. FHT wnl today. Pt bought doppler at home but unable to hear FHR, causing anxiety.  [facility] also reports intermittent pain on left side and was told she had a cyst on left. Corpus luteum cyst 1 cm in size noted on USKoreareviewed these normal findings with pt.  Outpatient USKoreardered to f/u on bleeding/pain.  4. [redacted] weeks gestation of pregnancy --F/U related to bleeding in early second trimester that started in first trimester.  Ordered for 08/10/16. - USKoreaFM OB LIMITED; Future  5. [redacted] weeks gestation of pregnancy --Anatomy scan ordered with MFM. - US[place]oreaFM OB COMP + 1424K; Future  6. Hx of preeclampsia, prior pregnancy, currently pregnant, second trimester --Normal BP today --Baby ASA daily and baseline labs today - Comp Met (CMET) - Protein / Creatinine Ratio, Urine - Protein, urine, 24 hour - Creatinine clearance, urine, 24 hour   Preterm labor symptoms and general obstetric precautions including but not limited to vaginal bleeding, contractions, leaking of fluid and fetal movement were reviewed in detail with the patient. Please refer to After Visit Summary for other counseling recommendations.  Return in about 2  weeks (around 08/10/2016) for lab only.   Leftwich-Kirby, Kathie Dike, CNM

## 2016-07-28 LAB — PROTEIN / CREATININE RATIO, URINE
Creatinine, Urine: 106.6 mg/dL
PROTEIN UR: 16.4 mg/dL
Protein/Creat Ratio: 154 mg/g creat (ref 0–200)

## 2016-07-28 LAB — COMPREHENSIVE METABOLIC PANEL
A/G RATIO: 1.3 (ref 1.2–2.2)
ALBUMIN: 3.8 g/dL (ref 3.5–5.5)
ALT: 10 IU/L (ref 0–32)
AST: 14 IU/L (ref 0–40)
Alkaline Phosphatase: 85 IU/L (ref 39–117)
BUN / CREAT RATIO: 10 (ref 9–23)
BUN: 5 mg/dL — ABNORMAL LOW (ref 6–20)
Bilirubin Total: 0.2 mg/dL (ref 0.0–1.2)
CALCIUM: 9.1 mg/dL (ref 8.7–10.2)
CO2: 19 mmol/L (ref 18–29)
CREATININE: 0.48 mg/dL — AB (ref 0.57–1.00)
Chloride: 98 mmol/L (ref 96–106)
GFR, EST AFRICAN AMERICAN: 163 mL/min/{1.73_m2} (ref >59)
GFR, EST NON AFRICAN AMERICAN: 142 mL/min/{1.73_m2} (ref >59)
GLOBULIN, TOTAL: 3 g/dL (ref 1.5–4.5)
Glucose: 87 mg/dL (ref 65–99)
POTASSIUM: 4.5 mmol/L (ref 3.5–5.2)
SODIUM: 136 mmol/L (ref 134–144)
Total Protein: 6.8 g/dL (ref 6.0–8.5)

## 2016-07-28 LAB — OBSTETRIC PANEL, INCLUDING HIV
Antibody Screen: NEGATIVE
BASOS: 0 %
Basophils Absolute: 0 10*3/uL (ref 0.0–0.2)
EOS (ABSOLUTE): 0.1 10*3/uL (ref 0.0–0.4)
EOS: 1 %
HEMATOCRIT: 38.4 % (ref 34.0–46.6)
HEMOGLOBIN: 12.6 g/dL (ref 11.1–15.9)
HEP B S AG: NEGATIVE
HIV SCREEN 4TH GENERATION: NONREACTIVE
Immature Grans (Abs): 0 10*3/uL (ref 0.0–0.1)
Immature Granulocytes: 0 %
Lymphocytes Absolute: 1.9 10*3/uL (ref 0.7–3.1)
Lymphs: 22 %
MCH: 29.5 pg (ref 26.6–33.0)
MCHC: 32.8 g/dL (ref 31.5–35.7)
MCV: 90 fL (ref 79–97)
MONOCYTES: 4 %
MONOS ABS: 0.4 10*3/uL (ref 0.1–0.9)
NEUTROS ABS: 6.5 10*3/uL (ref 1.4–7.0)
Neutrophils: 73 %
Platelets: 303 10*3/uL (ref 150–379)
RBC: 4.27 x10E6/uL (ref 3.77–5.28)
RDW: 13.3 % (ref 12.3–15.4)
RH TYPE: POSITIVE
RPR: NONREACTIVE
RUBELLA: 1.07 {index} (ref >0.99)
WBC: 8.9 10*3/uL (ref 3.4–10.8)

## 2016-07-28 LAB — CERVICOVAGINAL ANCILLARY ONLY
CHLAMYDIA, DNA PROBE: NEGATIVE
Neisseria Gonorrhea: NEGATIVE

## 2016-07-29 LAB — URINE CULTURE, OB REFLEX

## 2016-07-29 LAB — CULTURE, OB URINE

## 2016-08-02 LAB — CYSTIC FIBROSIS MUTATION 97: Interpretation: NOT DETECTED

## 2016-08-10 ENCOUNTER — Ambulatory Visit (HOSPITAL_COMMUNITY)
Admission: RE | Admit: 2016-08-10 | Discharge: 2016-08-10 | Disposition: A | Discharge status: Home / Self Care | Payer: Medicaid Other | Source: Ambulatory Visit | Attending: Advanced Practice Midwife | Admitting: Advanced Practice Midwife

## 2016-08-10 ENCOUNTER — Encounter: Payer: Self-pay | Admitting: Family Medicine

## 2016-08-10 ENCOUNTER — Other Ambulatory Visit: Payer: Medicaid Other

## 2016-08-10 DIAGNOSIS — O99210 Obesity complicating pregnancy, unspecified trimester: Secondary | ICD-10-CM | POA: Insufficient documentation

## 2016-08-10 DIAGNOSIS — O209 Hemorrhage in early pregnancy, unspecified: Secondary | ICD-10-CM | POA: Diagnosis not present

## 2016-08-10 DIAGNOSIS — E669 Obesity, unspecified: Secondary | ICD-10-CM | POA: Diagnosis not present

## 2016-08-10 DIAGNOSIS — O09292 Supervision of pregnancy with other poor reproductive or obstetric history, second trimester: Secondary | ICD-10-CM | POA: Diagnosis not present

## 2016-08-10 DIAGNOSIS — Z3689 Encounter for other specified antenatal screening: Secondary | ICD-10-CM | POA: Insufficient documentation

## 2016-08-10 DIAGNOSIS — Z3A15 15 weeks gestation of pregnancy: Secondary | ICD-10-CM | POA: Insufficient documentation

## 2016-08-10 DIAGNOSIS — O99212 Obesity complicating pregnancy, second trimester: Secondary | ICD-10-CM | POA: Insufficient documentation

## 2016-08-10 DIAGNOSIS — Z3A13 13 weeks gestation of pregnancy: Secondary | ICD-10-CM

## 2016-08-10 DIAGNOSIS — Z3481 Encounter for supervision of other normal pregnancy, first trimester: Secondary | ICD-10-CM

## 2016-08-10 DIAGNOSIS — Z3480 Encounter for supervision of other normal pregnancy, unspecified trimester: Secondary | ICD-10-CM

## 2016-08-13 LAB — AFP TETRA
DIA Mom Value: 0.7
DIA Value (EIA): 103.94 pg/mL
DSR (BY AGE) 1 IN: 1153
DSR (Second Trimester) 1 IN: 10000
Gestational Age: 15 WEEKS
MSAFP MOM: 0.73
MSAFP: 16.1 ng/mL
MSHCG MOM: 0.3
MSHCG: 12452 m[IU]/mL
Maternal Age At EDD: 20.7 yr
Osb Risk: 10000
Test Results:: NEGATIVE
UE3 VALUE: 0.41 ng/mL
Weight: 219 [lb_av]
uE3 Mom: 0.82

## 2016-08-13 LAB — GLUCOSE TOLERANCE, 2 HOURS W/ 1HR
Glucose, 1 hour: 147 mg/dL (ref 65–179)
Glucose, 2 hour: 111 mg/dL (ref 65–152)
Glucose, Fasting: 76 mg/dL (ref 65–91)

## 2016-08-16 ENCOUNTER — Telehealth: Payer: Self-pay | Admitting: *Deleted

## 2016-08-16 NOTE — Telephone Encounter (Addendum)
Pt left message stating that she is peeing very often and thinks she may have a UTI.  She wants to know if she can be prescribed some medication. I called back and left a message stating that she will need to come to office and provide a urine sample for testing. We can then give a Rx.  Instructions given for pt to call back to appt line and schedule to come in today or tomorrow. Per standing order, pt may have Macrobid 100 mg PO BID x7 days once sample has been collected.   6/8  1000  Called pt and discussed her previous call.  She stated that she had called her PCP and was told she could take OTC AZO during pregnancy. Her problem has cleared up. I advised pt to inform us when she comes to appt on 6/13 if she is having any sx of UTI and we will test her urine.  Pt voiced understanding.

## 2016-08-24 ENCOUNTER — Encounter: Payer: Self-pay | Admitting: Advanced Practice Midwife

## 2016-08-24 ENCOUNTER — Ambulatory Visit (INDEPENDENT_AMBULATORY_CARE_PROVIDER_SITE_OTHER): Payer: Medicaid Other | Admitting: Advanced Practice Midwife

## 2016-08-24 DIAGNOSIS — O09299 Supervision of pregnancy with other poor reproductive or obstetric history, unspecified trimester: Secondary | ICD-10-CM

## 2016-08-24 DIAGNOSIS — Z3481 Encounter for supervision of other normal pregnancy, first trimester: Secondary | ICD-10-CM

## 2016-08-24 DIAGNOSIS — O09292 Supervision of pregnancy with other poor reproductive or obstetric history, second trimester: Secondary | ICD-10-CM

## 2016-08-24 NOTE — Patient Instructions (Signed)

## 2016-08-24 NOTE — Progress Notes (Signed)
Patient complain of sharp period cramps 08/23/2016, for 2 hours at the top part of her stomach. Patient stated the cramp pain was at a level 8.

## 2016-08-24 NOTE — Progress Notes (Signed)
   PRENATAL VISIT NOTE  Subjective:  Breanna Carrillo is a 21 y.o. G2P1001 at 4170w0d being seen today for ongoing prenatal care.  She is currently monitored for the following issues for this low-risk pregnancy and has Hyperemesis gravidarum; Supervision of normal pregnancy in first trimester; Obesity affecting pregnancy, antepartum; Hypercholesterolemia; Obesity (BMI 35.0-39.9 without comorbidity); and Hx of preeclampsia, prior pregnancy, currently pregnant on her problem list.  Patient reports backache and intermittent upper abdominal cramps.  Contractions: Not present. Vag. Bleeding: Bloody Show, Scant.   . Denies leaking of fluid.  Takes her BP at home. States it goes up and down.  Sometimes 130/100 then 5 min later it was 95/40.  Discussed some cuffs are inconsistent.  Discussed proper placement and size of cuff. May want to use forearm  The following portions of the patient's history were reviewed and updated as appropriate: allergies, current medications, past family history, past medical history, past social history, past surgical history and problem list. Problem list updated.  Objective:   Vitals:   08/24/16 0902  BP: 112/73  Pulse: 83  Weight: 218 lb 3.2 oz (99 kg)    Fetal Status: Fetal Heart Rate (bpm): 145         General:  Alert, oriented and cooperative. Patient is in no acute distress.  Skin: Skin is warm and dry. No rash noted.   Cardiovascular: Normal heart rate noted  Respiratory: Normal respiratory effort, no problems with respiration noted  Abdomen: Soft, gravid, appropriate for gestational age. Pain/Pressure: Absent     Pelvic:  Cervical exam deferred        Extremities: Normal range of motion.  Edema: Trace  Mental Status: Normal mood and affect. Normal behavior. Normal judgment and thought content.   Assessment and Plan:  Pregnancy: G2P1001 at 7570w0d  1. Encounter for supervision of other normal pregnancy in first trimester     Quad Screen Negative  2. Hx of  preeclampsia, prior pregnancy, currently pregnant     Taking ASA      Reviewed signs of preeclampsia usually don't present until after 24 wks  Preterm labor symptoms and general obstetric precautions including but not limited to vaginal bleeding, contractions, leaking of fluid and fetal movement were reviewed in detail with the patient. Please refer to After Visit Summary for other counseling recommendations.  Return in about 4 weeks (around 09/21/2016) for [facility].  Question Babyscripts schedule, given BP lability at home and history of preeclampsia. Will bring in 4 wks to assess.   Wynelle BourgeoisMarie Calvin Jablonowski, CNM

## 2016-08-31 ENCOUNTER — Other Ambulatory Visit: Payer: Self-pay | Admitting: *Deleted

## 2016-08-31 DIAGNOSIS — O09299 Supervision of pregnancy with other poor reproductive or obstetric history, unspecified trimester: Secondary | ICD-10-CM

## 2016-09-05 ENCOUNTER — Ambulatory Visit (HOSPITAL_COMMUNITY)
Admission: RE | Admit: 2016-09-05 | Discharge: 2016-09-05 | Disposition: A | Discharge status: Home / Self Care | Payer: Medicaid Other | Source: Ambulatory Visit | Attending: Advanced Practice Midwife | Admitting: Advanced Practice Midwife

## 2016-09-05 ENCOUNTER — Other Ambulatory Visit: Payer: Self-pay | Admitting: Family Medicine

## 2016-09-05 DIAGNOSIS — Z3A18 18 weeks gestation of pregnancy: Secondary | ICD-10-CM | POA: Insufficient documentation

## 2016-09-05 DIAGNOSIS — Z363 Encounter for antenatal screening for malformations: Secondary | ICD-10-CM

## 2016-09-05 DIAGNOSIS — E669 Obesity, unspecified: Secondary | ICD-10-CM | POA: Insufficient documentation

## 2016-09-05 DIAGNOSIS — Z6834 Body mass index (BMI) 34.0-34.9, adult: Secondary | ICD-10-CM | POA: Insufficient documentation

## 2016-09-05 DIAGNOSIS — O09292 Supervision of pregnancy with other poor reproductive or obstetric history, second trimester: Secondary | ICD-10-CM | POA: Insufficient documentation

## 2016-09-05 DIAGNOSIS — O99212 Obesity complicating pregnancy, second trimester: Secondary | ICD-10-CM | POA: Diagnosis not present

## 2016-09-05 DIAGNOSIS — O09299 Supervision of pregnancy with other poor reproductive or obstetric history, unspecified trimester: Secondary | ICD-10-CM

## 2016-09-21 ENCOUNTER — Ambulatory Visit (INDEPENDENT_AMBULATORY_CARE_PROVIDER_SITE_OTHER): Payer: Medicaid Other | Admitting: Advanced Practice Midwife

## 2016-09-21 ENCOUNTER — Encounter: Payer: Self-pay | Admitting: Advanced Practice Midwife

## 2016-09-21 VITALS — BP 115/67 | HR 84 | Wt 222.7 lb

## 2016-09-21 DIAGNOSIS — Z3A25 25 weeks gestation of pregnancy: Secondary | ICD-10-CM

## 2016-09-21 DIAGNOSIS — O09299 Supervision of pregnancy with other poor reproductive or obstetric history, unspecified trimester: Secondary | ICD-10-CM

## 2016-09-21 DIAGNOSIS — Z0489 Encounter for examination and observation for other specified reasons: Secondary | ICD-10-CM

## 2016-09-21 DIAGNOSIS — Z3481 Encounter for supervision of other normal pregnancy, first trimester: Secondary | ICD-10-CM

## 2016-09-21 DIAGNOSIS — O09292 Supervision of pregnancy with other poor reproductive or obstetric history, second trimester: Secondary | ICD-10-CM

## 2016-09-21 DIAGNOSIS — IMO0002 Reserved for concepts with insufficient information to code with codable children: Secondary | ICD-10-CM

## 2016-09-21 NOTE — Patient Instructions (Signed)
AREA PEDIATRIC/FAMILY PRACTICE PHYSICIANS  [facility] [address], [address] [place], [place]  [address] Phone - [phone]   Fax - [phone]  [facility] [address] [place], [place] [address] Phone - [phone]   Fax - [phone]  [facility] [address] [place], [place]  [address] Phone - [phone]   Fax - [phone]  [facility] [address] [place], [place]  [address] Phone - [phone]   Fax - [phone]  [facility] [address] [place], [place]  [address] Phone - [phone]   Fax - [phone]  [facility] [address] [place], [place]  [address] Phone - [phone]   Fax - [phone]  [facility] [address] [place], [place]  [address] Phone - [phone]   Fax - [phone]  [facility] [address], [address] [place], [place]  [address] Phone - [phone]   Fax - [phone]  [facility] [address] [place], [place]  [address] Phone - [phone]   Fax - [phone] [facility] [address] [place], [place]  [address] Phone - [phone]   Fax - [phone]  [facility] [address] [place], [place]  [address] Phone - [phone]   Fax - [phone]  [facility] [address] [place], [place]  [address] Phone - [phone]   Fax - [phone]  [facility] [address] [place], [place]  [address] Phone - [phone]   Fax - [phone]  [facility] [address] [place], [place]  [address] Phone - [phone]  [facility] [address] [place], [place]  [address] Phone - [phone]   Fax - [phone]  [facility] [address] [place], [place]  [address] Phone - [phone]   Fax - [phone]  [facility] [address] [place], [place]  [address] Phone - [phone]   Fax - [phone]  [facility] [address] [place], [place]  [address] Phone - [phone]   Fax - [phone]   [facility] [address] [place], [place]  [address] Phone - [phone]   Fax - [phone]  [facility] [address] [place], [place]  [address] Phone - [phone]   Fax - [phone]  [facility] [address] [place], [place]  [address] Phone - [phone]   Fax - [phone]  [facility] [address] [place], [place]  [address] Phone - [phone]   Fax - [phone]  [facility] [address] , [place]  [address] Phone - [phone]   Fax - [phone] [facility] [address] [place], [place]  [address] Phone - [phone]   Fax - [phone]  [facility] [address] [place], [place]  [address] Phone - [phone]   Fax - [phone]  [facility] [address] [place], [place]  [address] Phone - [phone]   Fax - [phone]  [facility] Charlene Flemming MD [address] [place]  [address] Phone [phone]  Fax [phone]  Places to have your son circumcised:    [facility] [phone] $480 by 4   wks  [facility] [phone] $244 by 4 wks  [facility] [phone] $175 by 2 wks  [facility] [phone] $250 by 7 days [facility] [phone] $[phone] by 4 wks  These prices sometimes change but are roughly what you can expect to pay. Please call and  confirm pricing.   Circumcision is considered an elective/non-medically necessary procedure. There are many reasons parents decide to have their sons circumsized. During the first year of life circumcised males have a reduced risk of urinary tract infections but after this year the rates between circumcised males and uncircumcised males are the same.  It is safe to have your son circumcised outside of the hospital and the places above perform them regularly.

## 2016-09-21 NOTE — Progress Notes (Signed)
   PRENATAL VISIT NOTE  Subjective:  Breanna Carrillo is a 21 y.o. G2P1001 at 5324w0d being seen today for ongoing prenatal care.  She is currently monitored for the following issues for this low-risk pregnancy and has Hyperemesis gravidarum; Supervision of normal pregnancy in first trimester; Obesity affecting pregnancy, antepartum; Hypercholesterolemia; Obesity (BMI 35.0-39.9 without comorbidity); and Hx of preeclampsia, prior pregnancy, currently pregnant on her problem list.  Patient reports RLP.  Contractions: Irritability. Vag. Bleeding: None.  Movement: (!) Decreased. Denies leaking of fluid.   The following portions of the patient's history were reviewed and updated as appropriate: allergies, current medications, past family history, past medical history, past social history, past surgical history and problem list. Problem list updated.  Objective:   Vitals:   09/21/16 1028  BP: 115/67  Pulse: 84  Weight: 222 lb 11.2 oz (101 kg)    Fetal Status: Fetal Heart Rate (bpm): 130 Fundal Height: 22 cm Movement: (!) Decreased     General:  Alert, oriented and cooperative. Patient is in no acute distress.  Skin: Skin is warm and dry. No rash noted.   Cardiovascular: Normal heart rate noted  Respiratory: Normal respiratory effort, no problems with respiration noted  Abdomen: Soft, gravid, appropriate for gestational age. Pain/Pressure: Absent     Pelvic:  Cervical exam deferred        Extremities: Normal range of motion.  Edema: Trace  Mental Status: Normal mood and affect. Normal behavior. Normal judgment and thought content.   Assessment and Plan:  Pregnancy: G2P1001 at 2324w0d  1. Hx of preeclampsia, prior pregnancy, currently pregnant  - US MFM OB FOLLOW UP; Future  2. Encounter for supervision of other normal pregnancy in first trimester   3. [redacted] weeks gestation of pregnancy  - US MFM OB FOLLOW UP; Future  4. Evaluate anatomy not seen on prior sonogram  - US MFM OB FOLLOW  UP; Future  Preterm labor symptoms and general obstetric precautions including but not limited to vaginal bleeding, contractions, leaking of fluid and fetal movement were reviewed in detail with the patient. Please refer to After Visit Summary for other counseling recommendations.  Return in about 8 weeks (around 11/16/2016) for ROB/GTT.   Dorathy KinsmanVirginia Manley Fason, CNM

## 2016-09-21 NOTE — Progress Notes (Signed)
Patient stated that she doesn't feel the baby move much during the day, only at night. Also patient states she noticed at night her BP elevates at usually 137/90 something.

## 2016-09-29 ENCOUNTER — Telehealth: Payer: Self-pay | Admitting: General Practice

## 2016-09-29 NOTE — Telephone Encounter (Signed)
Patient called and left message stating she is [redacted] weeks pregnant and woke up this morning spotting. Patient states she thought it would've stopped by now but it hasn't. Called patient, no answer- left message stating we are trying to reach you to return your phone call, sorry we missed you. You may call us back or send us a mychart message.

## 2016-10-12 NOTE — Telephone Encounter (Signed)
Called patient left message for her to call us back regarding spotting. Patient has not reach back out to us regarding spotting. No records of her going to ER in her chart.

## 2016-10-21 ENCOUNTER — Telehealth: Payer: Self-pay | Admitting: General Practice

## 2016-10-21 NOTE — Telephone Encounter (Signed)
Called and left message on VM in regards to follow up appointment on 11/15/16 at 8:00am.  Asked patient to give our office a call if unable to keep appointment.

## 2016-10-24 ENCOUNTER — Encounter (HOSPITAL_COMMUNITY): Payer: Self-pay

## 2016-10-24 ENCOUNTER — Ambulatory Visit (HOSPITAL_COMMUNITY)
Admission: RE | Admit: 2016-10-24 | Discharge: 2016-10-24 | Disposition: A | Discharge status: Home / Self Care | Payer: Medicaid Other | Source: Ambulatory Visit | Attending: Advanced Practice Midwife | Admitting: Advanced Practice Midwife

## 2016-10-24 DIAGNOSIS — O09292 Supervision of pregnancy with other poor reproductive or obstetric history, second trimester: Secondary | ICD-10-CM | POA: Insufficient documentation

## 2016-10-24 DIAGNOSIS — Z3A25 25 weeks gestation of pregnancy: Secondary | ICD-10-CM | POA: Insufficient documentation

## 2016-10-24 DIAGNOSIS — IMO0002 Reserved for concepts with insufficient information to code with codable children: Secondary | ICD-10-CM

## 2016-10-24 DIAGNOSIS — O99212 Obesity complicating pregnancy, second trimester: Secondary | ICD-10-CM | POA: Insufficient documentation

## 2016-10-24 DIAGNOSIS — Z0489 Encounter for examination and observation for other specified reasons: Secondary | ICD-10-CM

## 2016-10-24 DIAGNOSIS — Z362 Encounter for other antenatal screening follow-up: Secondary | ICD-10-CM | POA: Insufficient documentation

## 2016-10-24 DIAGNOSIS — O09299 Supervision of pregnancy with other poor reproductive or obstetric history, unspecified trimester: Secondary | ICD-10-CM

## 2016-11-09 ENCOUNTER — Inpatient Hospital Stay (HOSPITAL_COMMUNITY)
Admission: AD | Admit: 2016-11-09 | Discharge: 2016-11-09 | Disposition: A | Discharge status: Home / Self Care | Payer: Medicaid Other | Source: Ambulatory Visit | Attending: Obstetrics and Gynecology | Admitting: Obstetrics and Gynecology

## 2016-11-09 ENCOUNTER — Encounter (HOSPITAL_COMMUNITY): Payer: Self-pay | Admitting: *Deleted

## 2016-11-09 DIAGNOSIS — Z3A28 28 weeks gestation of pregnancy: Secondary | ICD-10-CM | POA: Diagnosis not present

## 2016-11-09 DIAGNOSIS — O2343 Unspecified infection of urinary tract in pregnancy, third trimester: Secondary | ICD-10-CM | POA: Insufficient documentation

## 2016-11-09 DIAGNOSIS — Z7982 Long term (current) use of aspirin: Secondary | ICD-10-CM | POA: Diagnosis not present

## 2016-11-09 DIAGNOSIS — M545 Low back pain: Secondary | ICD-10-CM | POA: Insufficient documentation

## 2016-11-09 DIAGNOSIS — O26893 Other specified pregnancy related conditions, third trimester: Secondary | ICD-10-CM | POA: Diagnosis present

## 2016-11-09 DIAGNOSIS — Z3689 Encounter for other specified antenatal screening: Secondary | ICD-10-CM | POA: Diagnosis not present

## 2016-11-09 DIAGNOSIS — O368120 Decreased fetal movements, second trimester, not applicable or unspecified: Secondary | ICD-10-CM

## 2016-11-09 DIAGNOSIS — Z8249 Family history of ischemic heart disease and other diseases of the circulatory system: Secondary | ICD-10-CM | POA: Diagnosis not present

## 2016-11-09 DIAGNOSIS — O368130 Decreased fetal movements, third trimester, not applicable or unspecified: Secondary | ICD-10-CM

## 2016-11-09 DIAGNOSIS — Z79899 Other long term (current) drug therapy: Secondary | ICD-10-CM | POA: Diagnosis not present

## 2016-11-09 DIAGNOSIS — O2342 Unspecified infection of urinary tract in pregnancy, second trimester: Secondary | ICD-10-CM

## 2016-11-09 DIAGNOSIS — Z833 Family history of diabetes mellitus: Secondary | ICD-10-CM | POA: Diagnosis not present

## 2016-11-09 DIAGNOSIS — O99513 Diseases of the respiratory system complicating pregnancy, third trimester: Secondary | ICD-10-CM | POA: Diagnosis not present

## 2016-11-09 DIAGNOSIS — R1031 Right lower quadrant pain: Secondary | ICD-10-CM | POA: Diagnosis not present

## 2016-11-09 LAB — CBC WITH DIFFERENTIAL/PLATELET
BASOS PCT: 0 %
Basophils Absolute: 0 10*3/uL (ref 0.0–0.1)
Eosinophils Absolute: 0 10*3/uL (ref 0.0–0.7)
Eosinophils Relative: 0 %
HEMATOCRIT: 34.9 % — AB (ref 36.0–46.0)
Hemoglobin: 11.6 g/dL — ABNORMAL LOW (ref 12.0–15.0)
Lymphocytes Relative: 25 %
Lymphs Abs: 3 10*3/uL (ref 0.7–4.0)
MCH: 28.6 pg (ref 26.0–34.0)
MCHC: 33.2 g/dL (ref 30.0–36.0)
MCV: 86.2 fL (ref 78.0–100.0)
MONO ABS: 0.4 10*3/uL (ref 0.1–1.0)
MONOS PCT: 3 %
NEUTROS ABS: 8.7 10*3/uL — AB (ref 1.7–7.7)
Neutrophils Relative %: 72 %
Platelets: 265 10*3/uL (ref 150–400)
RBC: 4.05 MIL/uL (ref 3.87–5.11)
RDW: 13.6 % (ref 11.5–15.5)
WBC: 12.1 10*3/uL — ABNORMAL HIGH (ref 4.0–10.5)

## 2016-11-09 LAB — URINALYSIS, ROUTINE W REFLEX MICROSCOPIC
Bilirubin Urine: NEGATIVE
Glucose, UA: 150 mg/dL — AB
HGB URINE DIPSTICK: NEGATIVE
Ketones, ur: NEGATIVE mg/dL
Nitrite: NEGATIVE
PROTEIN: 30 mg/dL — AB
Specific Gravity, Urine: 1.024 (ref 1.005–1.030)
pH: 7 (ref 5.0–8.0)

## 2016-11-09 LAB — POCT FERN TEST: POCT FERN TEST: NEGATIVE

## 2016-11-09 LAB — WET PREP, GENITAL
CLUE CELLS WET PREP: NONE SEEN
Sperm: NONE SEEN
Trich, Wet Prep: NONE SEEN
YEAST WET PREP: NONE SEEN

## 2016-11-09 MED ORDER — ACETAMINOPHEN 500 MG PO TABS
1000.0000 mg | ORAL_TABLET | Freq: Four times a day (QID) | ORAL | Status: DC | PRN
  Administered 2016-11-09: 1000 mg via ORAL
  Filled 2016-11-09: qty 2

## 2016-11-09 MED ORDER — CEPHALEXIN 500 MG PO CAPS: 500.0000 mg | ORAL_CAPSULE | Freq: Four times a day (QID) | ORAL | 0 refills | Status: AC

## 2016-11-09 NOTE — MAU Note (Addendum)
Pt C/O L flank pain & mid-lower abd pain for the last hour, has HA.  Denies bleeding or LOF, has clear discharge.

## 2016-11-09 NOTE — MAU Provider Note (Signed)
History     CSN: [id number]  Arrival date and time: 11/09/16 1752   First Provider Initiated Contact with Patient 11/09/16 1856      Chief Complaint  Patient presents with  . Abdominal Pain  . Flank Pain   G2P1001 @28  wks here with RLQ pain and back pain. Pain started about 2 hrs ago. She describes the RLQ pain as constant and the low back pain on left side and left flank. She has not used anything for the pain. She denies all urinary sx except urgency which started today. Denies fever but felt hot and cold today. She denies VB and ctx but reports that her underwear was wet when she got here, denies gush of fluid. Also reports decreased FM since last night.    OB History    Gravida Para Term Preterm AB Living   2 1 1     1    SAB TAB Ectopic Multiple Live Births           1      Past Medical History:  Diagnosis Date  . Asthma   . Pregnancy induced hypertension     Past Surgical History:  Procedure Laterality Date  . JOINT REPLACEMENT      Family History  Problem Relation Age of Onset  . Hypertension Father   . Diabetes Father   . Diabetes Maternal Grandmother   . Hypertension Paternal Grandmother     Social History  Substance Use Topics  . Smoking status: Never Smoker  . Smokeless tobacco: Never Used  . Alcohol use No    Allergies: No Known Allergies  Prescriptions Prior to Admission  Medication Sig Dispense Refill Last Dose  . aspirin EC 81 MG tablet Take 1 tablet (81 mg total) by mouth daily. 30 tablet 8 Taking  . cephALEXin (KEFLEX) 500 MG capsule Take 1 capsule (500 mg total) by mouth 2 (two) times daily. (Patient not taking: Reported on 07/27/2016) 14 capsule 0 Not Taking  . famotidine (PEPCID) 20 MG tablet Take 1 tablet (20 mg total) by mouth 2 (two) times daily. 60 tablet 0 Taking  . ibuprofen (ADVIL,MOTRIN) 800 MG tablet Take 1 tablet (800 mg total) by mouth every 8 (eight) hours as needed. (Patient not taking: Reported on 07/27/2016) 30 tablet 0 Not  Taking  . metoCLOPramide (REGLAN) 10 MG tablet Take 1 tablet (10 mg total) by mouth every 6 (six) hours as needed. (Patient not taking: Reported on 10/24/2016) 30 tablet 0 Not Taking  . ondansetron (ZOFRAN ODT) 4 MG disintegrating tablet Take 1 tablet (4 mg total) by mouth every 6 (six) hours as needed for nausea. 20 tablet 2 Taking  . ondansetron (ZOFRAN) 4 MG tablet Take 1 tablet (4 mg total) by mouth every 8 (eight) hours as needed for nausea or vomiting. (Patient not taking: Reported on 07/27/2016) 10 tablet 0 Not Taking  . Prenatal Vit-Fe Fumarate-FA (PRENATAL MULTIVITAMIN) TABS tablet Take 1 tablet by mouth daily at 12 noon.   Taking    Review of Systems  Constitutional: Negative for chills and fever.  Gastrointestinal: Positive for abdominal pain. Negative for nausea and vomiting.  Genitourinary: Positive for urgency and vaginal discharge. Negative for dysuria, frequency, hematuria and vaginal bleeding.  Musculoskeletal: Positive for back pain.   Physical Exam   Blood pressure (!) 88/52, pulse (!) 114, temperature 98 F (36.7 C), temperature source Oral, resp. rate 18, last menstrual period 04/27/2016.  Physical Exam  Constitutional: She is oriented to person, place, and  time. She appears well-developed and well-nourished. No distress.  HENT:  Head: Normocephalic and atraumatic.  Neck: Normal range of motion.  Respiratory: Effort normal. No respiratory distress.  GI: Soft. She exhibits no distension and no mass. There is tenderness (RLQ). There is no rebound, no guarding and no CVA tenderness.  gravid  Genitourinary:  Genitourinary Comments: External: no lesions or erythema Vagina: rugated, pink, moist, thick yellow discharge, no pool, fern neg Cervix closed/thick  Musculoskeletal: Normal range of motion.       Cervical back: Normal.       Thoracic back: Normal.       Lumbar back: She exhibits tenderness (left).  Neurological: She is alert and oriented to person, place, and  time.  Skin: Skin is warm and dry.  Psychiatric: She has a normal mood and affect.  EFM: 135 bpm, mod variability, + accels, no decels Toco: irritability  Results for orders placed or performed during the hospital encounter of 11/09/16 (from the past 24 hour(s))  Urinalysis, Routine w reflex microscopic     Status: Abnormal   Collection Time: 11/09/16  6:17 PM  Result Value Ref Range   Color, Urine YELLOW YELLOW   APPearance HAZY (A) CLEAR   Specific Gravity, Urine 1.024 1.005 - 1.030   pH 7.0 5.0 - 8.0   Glucose, UA 150 (A) NEGATIVE mg/dL   Hgb urine dipstick NEGATIVE NEGATIVE   Bilirubin Urine NEGATIVE NEGATIVE   Ketones, ur NEGATIVE NEGATIVE mg/dL   Protein, ur 30 (A) NEGATIVE mg/dL   Nitrite NEGATIVE NEGATIVE   Leukocytes, UA LARGE (A) NEGATIVE   RBC / HPF 0-5 0 - 5 RBC/hpf   WBC, UA 6-30 0 - 5 WBC/hpf   Bacteria, UA RARE (A) NONE SEEN   Squamous Epithelial / LPF 6-30 (A) NONE SEEN   Mucus PRESENT   CBC with Differential/Platelet     Status: Abnormal   Collection Time: 11/09/16  7:15 PM  Result Value Ref Range   WBC 12.1 (H) 4.0 - 10.5 K/uL   RBC 4.05 3.87 - 5.11 MIL/uL   Hemoglobin 11.6 (L) 12.0 - 15.0 g/dL   HCT 40.9 (L) 81.1 - 91.4 %   MCV 86.2 78.0 - 100.0 fL   MCH 28.6 26.0 - 34.0 pg   MCHC 33.2 30.0 - 36.0 g/dL   RDW 78.2 95.6 - 21.3 %   Platelets 265 150 - 400 K/uL   Neutrophils Relative % 72 %   Neutro Abs 8.7 (H) 1.7 - 7.7 K/uL   Lymphocytes Relative 25 %   Lymphs Abs 3.0 0.7 - 4.0 K/uL   Monocytes Relative 3 %   Monocytes Absolute 0.4 0.1 - 1.0 K/uL   Eosinophils Relative 0 %   Eosinophils Absolute 0.0 0.0 - 0.7 K/uL   Basophils Relative 0 %   Basophils Absolute 0.0 0.0 - 0.1 K/uL  Wet prep, genital     Status: Abnormal   Collection Time: 11/09/16  7:32 PM  Result Value Ref Range   Yeast Wet Prep HPF POC NONE SEEN NONE SEEN   Trich, Wet Prep NONE SEEN NONE SEEN   Clue Cells Wet Prep HPF POC NONE SEEN NONE SEEN   WBC, Wet Prep HPF POC MANY (A) NONE  SEEN   Sperm NONE SEEN   Fern Test     Status: None   Collection Time: 11/09/16  7:58 PM  Result Value Ref Range   POCT Fern Test Negative = intact amniotic membranes    MAU Course  Procedures Tylenol Heat pad  MDM Labs ordered and reviewed. Audible FM while on EFM. No evidence of ROM, PTL, acute abdominal process, or pyelo. Will treat UTI. UC sent. Stable for discharge home. After further discussion with pt she reports being seen at [facility] yesterday for an exposure to Chlamydia, she states her test was negative but she received treatment anyway.  Assessment and Plan   1. [redacted] weeks gestation of pregnancy   2. NST (non-stress test) reactive   3. Urinary tract infection in mother during second trimester of pregnancy   4. Decreased fetal movements in second trimester, single or unspecified fetus    Discharge home Follow up in OB office as scheduled Increase water intake PTL precautions Pyelo precautions FMCs  Allergies as of 11/09/2016   No Known Allergies     Medication List    STOP taking these medications   ibuprofen 800 MG tablet Commonly known as:  ADVIL,MOTRIN   metoCLOPramide 10 MG tablet Commonly known as:  REGLAN   ondansetron 4 MG disintegrating tablet Commonly known as:  ZOFRAN ODT   ondansetron 4 MG tablet Commonly known as:  ZOFRAN     TAKE these medications   aspirin EC 81 MG tablet Take 1 tablet (81 mg total) by mouth daily.   cephALEXin 500 MG capsule Commonly known as:  KEFLEX Take 1 capsule (500 mg total) by mouth 4 (four) times daily. What changed:  when to take this   famotidine 20 MG tablet Commonly known as:  PEPCID Take 1 tablet (20 mg total) by mouth 2 (two) times daily.   prenatal multivitamin Tabs tablet Take 1 tablet by mouth daily at 12 noon.            Discharge Care Instructions        Start     Ordered   11/09/16 0000  Discharge patient    Question Answer Comment  Discharge disposition 01-Home or Self Care    Discharge patient date 11/09/2016      11/09/16 2001   11/09/16 0000  cephALEXin (KEFLEX) 500 MG capsule  4 times daily    Question:  Supervising Provider  Answer:  Hermina Staggers   11/09/16 2001     Donette Larry, CNM 11/09/2016, 7:07 PM

## 2016-11-09 NOTE — MAU Note (Signed)
Pt presents with c/o left flank & lower abdominal pain that began 1 hour ago.  Denies VB, unsure if LOF.  States panties have been wet but hasn't had to change underwear.  Pt also c/o HA, hasn't taken any meds for c/o pain.

## 2016-11-09 NOTE — Discharge Instructions (Signed)
Pregnancy and Urinary Tract Infection °What is a urinary tract infection? °A urinary tract infection (UTI) is an infection of any part of the urinary tract. This includes the kidneys, the tubes that connect your kidneys to your bladder (ureters), the bladder, and the tube that carries urine out of your body (urethra). These organs make, store, and get rid of urine in the body. A UTI can be a bladder infection (cystitis) or a kidney infection (pyelonephritis). This infection may be caused by fungi, viruses, and bacteria. Bacteria are the most common cause of UTIs. °You are more likely to develop a UTI during pregnancy because: °· The physical and hormonal changes your body goes through can make it easier for bacteria to get into your urinary tract. °· Your growing baby puts pressure on your uterus and can affect urine flow. ° °Does a UTI place my baby at risk? °An untreated UTI during pregnancy could lead to a kidney infection, which can cause health problems that could affect your baby. Possible complications of an untreated UTI include: °· Having your baby before 37 weeks of pregnancy (premature). °· Having a baby with a low birth weight. °· Developing high blood pressure during pregnancy (preeclampsia). ° °What are the symptoms of a UTI? °Symptoms of a UTI include: °· Fever. °· Frequent urination or passing small amounts of urine frequently. °· Needing to urinate urgently. °· Pain or a burning sensation with urination. °· Urine that smells bad or unusual. °· Cloudy urine. °· Pain in the lower abdomen or back. °· Trouble urinating. °· Blood in the urine. °· Vomiting or being less hungry than normal. °· Diarrhea or abdominal pain. °· Vaginal discharge. ° °What are the treatment options for a UTI during pregnancy? °Treatment for this condition may include: °· Antibiotic medicines that are safe to take during pregnancy. °· Other medicines to treat less common causes of UTI. ° °How can I prevent a UTI? ° °To prevent a  UTI: °· Go to the bathroom as soon as you feel the need. °· Always wipe from front to back. °· Wash your genital area with soap and warm water daily. °· Empty your bladder before and after sex. °· Wear cotton underwear. °· Limit your intake of high sugar foods or drinks, such as regular soda, juice, and sweets. °· Drink 6-8 glasses of water daily. °· Do not wear tight-fitting pants. °· Do not douche or use deodorant sprays. °· Do not drink alcohol, caffeine, or carbonated drinks. These can irritate the bladder. ° °Contact a health care provider if: °· Your symptoms do not improve or get worse. °· You have a fever after two days of treatment. °· You have a rash. °· You have abnormal vaginal discharge. °· You have back or side pain. °· You have chills. °· You have nausea and vomiting. °Get help right away if: °Seek immediate medical care if you are pregnant and: °· You feel contractions in your uterus. °· You have lower belly pain. °· You have a gush of fluid from your vagina. °· You have blood in your urine. °· You are vomiting and cannot keep down any medicines or water. ° °This information is not intended to replace advice given to you by your health care provider. Make sure you discuss any questions you have with your health care provider. °Document Released: 06/25/2010 Document Revised: 02/12/2016 Document Reviewed: 01/19/2015 °Elsevier Interactive Patient Education © 2017 [employer] ° ° °Fetal Movement Counts °Patient Name: ________________________________________________ Patient Due Date: ____________________ °  a fetal movement count? A fetal movement count is the number of times that you feel your baby move during a certain amount of time. This may also be called a fetal kick count. A fetal movement count is recommended for every pregnant woman. You may be asked to start counting fetal movements as early as week 28 of your pregnancy. Pay attention to when your baby is most active. You may notice your  baby's sleep and wake cycles. You may also notice things that make your baby move more. You should do a fetal movement count:  When your baby is normally most active.  At the same time each day.  A good time to count movements is while you are resting, after having something to eat and drink. How do I count fetal movements? 1. Find a quiet, comfortable area. Sit, or lie down on your side. 2. Write down the date, the start time and stop time, and the number of movements that you felt between those two times. Take this information with you to your health care visits. 3. For 2 hours, count kicks, flutters, swishes, rolls, and jabs. You should feel at least 10 movements during 2 hours. 4. You may stop counting after you have felt 10 movements. 5. If you do not feel 10 movements in 2 hours, have something to eat and drink. Then, keep resting and counting for 1 hour. If you feel at least 4 movements during that hour, you may stop counting. Contact a health care provider if:  You feel fewer than 4 movements in 2 hours.  Your baby is not moving like he or she usually does. Date: ____________ Start time: ____________ Stop time: ____________ Movements: ____________ Date: ____________ Start time: ____________ Stop time: ____________ Movements: ____________ Date: ____________ Start time: ____________ Stop time: ____________ Movements: ____________ Date: ____________ Start time: ____________ Stop time: ____________ Movements: ____________ Date: ____________ Start time: ____________ Stop time: ____________ Movements: ____________ Date: ____________ Start time: ____________ Stop time: ____________ Movements: ____________ Date: ____________ Start time: ____________ Stop time: ____________ Movements: ____________ Date: ____________ Start time: ____________ Stop time: ____________ Movements: ____________ Date: ____________ Start time: ____________ Stop time: ____________ Movements: ____________ This  information is not intended to replace advice given to you by your health care provider. Make sure you discuss any questions you have with your health care provider. Document Released: 03/30/2006 Document Revised: 10/28/2015 Document Reviewed: 04/09/2015 Elsevier Interactive Patient Education  [employer]2018 [employer]

## 2016-11-10 LAB — GC/CHLAMYDIA PROBE AMP (~~LOC~~) NOT AT ARMC
CHLAMYDIA, DNA PROBE: NEGATIVE
NEISSERIA GONORRHEA: NEGATIVE

## 2016-11-11 LAB — CULTURE, OB URINE

## 2016-11-15 ENCOUNTER — Encounter: Payer: Self-pay | Admitting: Advanced Practice Midwife

## 2016-11-15 ENCOUNTER — Inpatient Hospital Stay (HOSPITAL_COMMUNITY)
Admission: AD | Admit: 2016-11-15 | Discharge: 2016-11-15 | Disposition: A | Discharge status: Home / Self Care | Payer: Medicaid Other | Source: Ambulatory Visit | Attending: Family Medicine | Admitting: Family Medicine

## 2016-11-15 ENCOUNTER — Encounter (HOSPITAL_COMMUNITY): Payer: Self-pay | Admitting: *Deleted

## 2016-11-15 ENCOUNTER — Ambulatory Visit (INDEPENDENT_AMBULATORY_CARE_PROVIDER_SITE_OTHER): Payer: Medicaid Other | Admitting: Advanced Practice Midwife

## 2016-11-15 ENCOUNTER — Telehealth: Payer: Self-pay | Admitting: General Practice

## 2016-11-15 VITALS — BP 129/102 | HR 98

## 2016-11-15 DIAGNOSIS — O169 Unspecified maternal hypertension, unspecified trimester: Secondary | ICD-10-CM

## 2016-11-15 DIAGNOSIS — O99613 Diseases of the digestive system complicating pregnancy, third trimester: Secondary | ICD-10-CM

## 2016-11-15 DIAGNOSIS — Z3A28 28 weeks gestation of pregnancy: Secondary | ICD-10-CM | POA: Diagnosis not present

## 2016-11-15 DIAGNOSIS — O99513 Diseases of the respiratory system complicating pregnancy, third trimester: Secondary | ICD-10-CM | POA: Insufficient documentation

## 2016-11-15 DIAGNOSIS — R030 Elevated blood-pressure reading, without diagnosis of hypertension: Secondary | ICD-10-CM | POA: Diagnosis not present

## 2016-11-15 DIAGNOSIS — O139 Gestational [pregnancy-induced] hypertension without significant proteinuria, unspecified trimester: Secondary | ICD-10-CM

## 2016-11-15 DIAGNOSIS — O219 Vomiting of pregnancy, unspecified: Secondary | ICD-10-CM

## 2016-11-15 DIAGNOSIS — O133 Gestational [pregnancy-induced] hypertension without significant proteinuria, third trimester: Secondary | ICD-10-CM

## 2016-11-15 DIAGNOSIS — Z23 Encounter for immunization: Secondary | ICD-10-CM

## 2016-11-15 DIAGNOSIS — O26893 Other specified pregnancy related conditions, third trimester: Secondary | ICD-10-CM | POA: Diagnosis present

## 2016-11-15 DIAGNOSIS — J45909 Unspecified asthma, uncomplicated: Secondary | ICD-10-CM | POA: Insufficient documentation

## 2016-11-15 DIAGNOSIS — Z3493 Encounter for supervision of normal pregnancy, unspecified, third trimester: Secondary | ICD-10-CM

## 2016-11-15 DIAGNOSIS — O163 Unspecified maternal hypertension, third trimester: Secondary | ICD-10-CM

## 2016-11-15 DIAGNOSIS — O99213 Obesity complicating pregnancy, third trimester: Secondary | ICD-10-CM | POA: Insufficient documentation

## 2016-11-15 DIAGNOSIS — Z7982 Long term (current) use of aspirin: Secondary | ICD-10-CM | POA: Diagnosis not present

## 2016-11-15 DIAGNOSIS — R110 Nausea: Secondary | ICD-10-CM

## 2016-11-15 DIAGNOSIS — Z3481 Encounter for supervision of other normal pregnancy, first trimester: Secondary | ICD-10-CM

## 2016-11-15 DIAGNOSIS — R51 Headache: Secondary | ICD-10-CM | POA: Diagnosis not present

## 2016-11-15 DIAGNOSIS — K219 Gastro-esophageal reflux disease without esophagitis: Secondary | ICD-10-CM

## 2016-11-15 DIAGNOSIS — R519 Headache, unspecified: Secondary | ICD-10-CM

## 2016-11-15 LAB — CBC
HEMATOCRIT: 32.8 % — AB (ref 36.0–46.0)
HEMOGLOBIN: 11.2 g/dL (ref 11.1–15.9)
HEMOGLOBIN: 10.9 g/dL — AB (ref 12.0–15.0)
Hematocrit: 32.9 % — ABNORMAL LOW (ref 34.0–46.6)
MCH: 28.6 pg (ref 26.6–33.0)
MCH: 28.4 pg (ref 26.0–34.0)
MCHC: 34 g/dL (ref 31.5–35.7)
MCHC: 33.2 g/dL (ref 30.0–36.0)
MCV: 84 fL (ref 79–97)
MCV: 85.4 fL (ref 78.0–100.0)
PLATELETS: 283 10*3/uL (ref 150–379)
Platelets: 256 10*3/uL (ref 150–400)
RBC: 3.92 x10E6/uL (ref 3.77–5.28)
RBC: 3.84 MIL/uL — ABNORMAL LOW (ref 3.87–5.11)
RDW: 13.3 % (ref 12.3–15.4)
RDW: 13.8 % (ref 11.5–15.5)
WBC: 10.3 10*3/uL (ref 3.4–10.8)
WBC: 11.3 10*3/uL — AB (ref 4.0–10.5)

## 2016-11-15 LAB — COMPREHENSIVE METABOLIC PANEL
A/G RATIO: 1.2 (ref 1.2–2.2)
ALK PHOS: 114 IU/L (ref 39–117)
ALK PHOS: 99 U/L (ref 38–126)
ALT: 9 IU/L (ref 0–32)
ALT: 10 U/L — ABNORMAL LOW (ref 14–54)
AST: 13 IU/L (ref 0–40)
AST: 16 U/L (ref 15–41)
Albumin: 3.4 g/dL — ABNORMAL LOW (ref 3.5–5.5)
Albumin: 2.8 g/dL — ABNORMAL LOW (ref 3.5–5.0)
Anion gap: 7 (ref 5–15)
BILIRUBIN TOTAL: 0.2 mg/dL (ref 0.0–1.2)
BILIRUBIN TOTAL: 0.3 mg/dL (ref 0.3–1.2)
BUN/Creatinine Ratio: 14 (ref 9–23)
BUN: 6 mg/dL (ref 6–20)
BUN: 6 mg/dL (ref 6–20)
CO2: 18 mmol/L — AB (ref 20–29)
CO2: 21 mmol/L — AB (ref 22–32)
CREATININE: 0.43 mg/dL — AB (ref 0.57–1.00)
Calcium: 8.6 mg/dL — ABNORMAL LOW (ref 8.7–10.2)
Calcium: 8.4 mg/dL — ABNORMAL LOW (ref 8.9–10.3)
Chloride: 105 mmol/L (ref 96–106)
Chloride: 107 mmol/L (ref 101–111)
Creatinine, Ser: 0.42 mg/dL — ABNORMAL LOW (ref 0.44–1.00)
GFR calc Af Amer: 169 mL/min/{1.73_m2} (ref >59)
GFR calc Af Amer: >60 mL/min (ref >60)
GFR calc non Af Amer: 147 mL/min/{1.73_m2} (ref >59)
GFR calc non Af Amer: >60 mL/min (ref >60)
GLUCOSE: 92 mg/dL (ref 65–99)
GLUCOSE: 68 mg/dL (ref 65–99)
Globulin, Total: 2.8 g/dL (ref 1.5–4.5)
POTASSIUM: 4.3 mmol/L (ref 3.5–5.1)
Potassium: 4.3 mmol/L (ref 3.5–5.2)
SODIUM: 135 mmol/L (ref 135–145)
Sodium: 139 mmol/L (ref 134–144)
TOTAL PROTEIN: 6.9 g/dL (ref 6.5–8.1)
Total Protein: 6.2 g/dL (ref 6.0–8.5)

## 2016-11-15 LAB — PROTEIN / CREATININE RATIO, URINE
Creatinine, Urine: 172.7 mg/dL
Creatinine, Urine: 289 mg/dL
PROTEIN UR: 30.3 mg/dL
PROTEIN/CREAT RATIO: 175 mg/g{creat} (ref 0–200)
Protein Creatinine Ratio: 0.08 mg/mg{Cre} (ref 0.00–0.15)
Total Protein, Urine: 23 mg/dL

## 2016-11-15 LAB — POCT URINALYSIS DIP (DEVICE)
Bilirubin Urine: NEGATIVE
GLUCOSE, UA: NEGATIVE mg/dL
HGB URINE DIPSTICK: NEGATIVE
NITRITE: NEGATIVE
PROTEIN: NEGATIVE mg/dL
UROBILINOGEN UA: 1 mg/dL (ref 0.0–1.0)
pH: 6.5 (ref 5.0–8.0)

## 2016-11-15 MED ORDER — ACETAMINOPHEN 500 MG PO TABS
1000.0000 mg | ORAL_TABLET | Freq: Once | ORAL | Status: AC
  Administered 2016-11-15: 1000 mg via ORAL

## 2016-11-15 MED ORDER — ONDANSETRON 4 MG PO TBDP
8.0000 mg | ORAL_TABLET | Freq: Once | ORAL | Status: AC
  Administered 2016-11-15: 8 mg via ORAL

## 2016-11-15 MED ORDER — PANTOPRAZOLE SODIUM 40 MG PO TBEC
40.0000 mg | DELAYED_RELEASE_TABLET | Freq: Every day | ORAL | 2 refills | Status: DC
Start: 2016-11-15 — End: 2017-01-14

## 2016-11-15 NOTE — Discharge Instructions (Signed)
Continue to check your blood pressure at home. Write down whenever your blood pressure is > 140/90. Bring your log and cuff to next office visit Call us or return to the [facility] if blood pressure is ever 160 or greater for the top number or 110 or greater for the second number.   How to Take Your Blood Pressure Blood pressure is a measurement of how strongly your blood is pressing against the walls of your arteries. Arteries are blood vessels that carry blood from your heart throughout your body. Your health care provider takes your blood pressure at each office visit. You can also take your own blood pressure at home with a blood pressure machine. You may need to take your own blood pressure:  To confirm a diagnosis of high blood pressure (hypertension).  To monitor your blood pressure over time.  To make sure your blood pressure medicine is working.  Supplies needed: To take your blood pressure, you will need a blood pressure machine. You can buy a blood pressure machine, or blood pressure monitor, at most drugstores or online. There are several types of home blood pressure monitors. When choosing one, consider the following:  Choose a monitor that has an arm cuff.  Choose a monitor that wraps snugly around your upper arm. You should be able to fit only one finger between your arm and the cuff.  Do not choose a monitor that measures your blood pressure from your wrist or finger.  Your health care provider can suggest a reliable monitor that will meet your needs. How to prepare To get the most accurate reading, avoid the following for 30 minutes before you check your blood pressure:  Drinking caffeine.  Drinking alcohol.  Eating.  Smoking.  Exercising.  Five minutes before you check your blood pressure:  Empty your bladder.  Sit quietly without talking in a dining chair, rather than in a soft couch or armchair.  How to take your blood pressure To check your blood  pressure, follow the instructions in the manual that came with your blood pressure monitor. If you have a digital blood pressure monitor, the instructions may be as follows: 1. Sit up straight. 2. Place your feet on the floor. Do not cross your ankles or legs. 3. Rest your left arm at the level of your heart on a table or desk or on the arm of a chair. 4. Pull up your shirt sleeve. 5. Wrap the blood pressure cuff around the upper part of your left arm, 1 inch (2.5 cm) above your elbow. It is best to wrap the cuff around bare skin. 6. Fit the cuff snugly around your arm. You should be able to place only one finger between the cuff and your arm. 7. Position the cord inside the groove of your elbow. 8. Press the power button. 9. Sit quietly while the cuff inflates and deflates. 10. Read the digital reading on the monitor screen and write it down (record it). 11. Wait 2-3 minutes, then repeat the steps, starting at step 1.  What does my blood pressure reading mean? A blood pressure reading consists of a higher number over a lower number. Ideally, your blood pressure should be below 120/80. The first ("top") number is called the systolic pressure. It is a measure of the pressure in your arteries as your heart beats. The second ("bottom") number is called the diastolic pressure. It is a measure of the pressure in your arteries as the heart relaxes. Blood  pressure is classified into four stages. The following are the stages for adults who do not have a short-term serious illness or a chronic condition. Systolic pressure and diastolic pressure are measured in a unit called mm Hg. Normal  Systolic pressure: below 120.  Diastolic pressure: below 80. Elevated  Systolic pressure: 120-129.  Diastolic pressure: below 80. Hypertension stage 1  Systolic pressure: 130-139.  Diastolic pressure: 80-89. Hypertension stage 2  Systolic pressure: 140 or above.  Diastolic pressure: 90 or above. You can  have prehypertension or hypertension even if only the systolic or only the diastolic number in your reading is higher than normal. Follow these instructions at home:  Check your blood pressure as often as recommended by your health care provider.  Take your monitor to the next appointment with your health care provider to make sure: ? That you are using it correctly. ? That it provides accurate readings.  Be sure you understand what your goal blood pressure numbers are.  Tell your health care provider if you are having any side effects from blood pressure medicine. Contact a health care provider if:  Your blood pressure is consistently high. Get help right away if:  Your systolic blood pressure is higher than 180.  Your diastolic blood pressure is higher than 110. This information is not intended to replace advice given to you by your health care provider. Make sure you discuss any questions you have with your health care provider. Document Released: 08/07/2015 Document Revised: 10/20/2015 Document Reviewed: 08/07/2015 Elsevier Interactive Patient Education  [employer]2018 [employer]

## 2016-11-15 NOTE — Telephone Encounter (Signed)
Called patient and left message on VM in regards to appointment scheduled on 11/18/16 at 10:15am for BP check.  Asked patient to give our office a call if unable to keep appointment.

## 2016-11-15 NOTE — Progress Notes (Signed)
Pt c/o heartburn, unresponsive to zantac. Also insomnia and frequent headaches. 28 week labs, declines tdap today

## 2016-11-15 NOTE — MAU Provider Note (Signed)
History     CSN: [id number]  Arrival date and time: 11/15/16 1041   First Provider Initiated Contact with Patient 11/15/16 1154      No chief complaint on file.   HPI: Breanna Carrillo is a 21 y.o. G2P1001 with IUP at [redacted]w[redacted]d who presents to maternity admissions sent form clinic d/t elevated blood pressures. She was first noted to have a elevated BP about a week ago here. Has been checking BP at home, and reports BP has been 140s/90s a few times. In clinic today, BP 129/102. She also reports having intermittent headaches for the past 3 weeks, some relief with Tylenol. Had a headache in clinic today, given Tylenol, and she reports this has improved some down to 2/10. Denies blurry vision, double vision, RUQ/epigastric pain. Endorses some LE swelling, worse later in the day.   Denies contractions, leakage of fluid or vaginal bleeding. Good fetal movement.  Also denies any abnormal vaginal discharge, fevers, chills, sweats, dysuria, urinary frequency, hematuria, nausea, vomiting.  She receives [facility]. Pregnancy complicated by obesity and h/o preeclampsia in previous pregnancy.  Past obstetric history: OB History  Gravida Para Term Preterm AB Living  2 1 1     1   SAB TAB Ectopic Multiple Live Births          1    # Outcome Date GA Lbr Len/2nd Weight Sex Delivery Anes PTL Lv  2 Current           1 Term 02/25/13 [redacted]w[redacted]d   F Vag-Spont EPI  LIV     Complications: Preeclampsia      Past Medical History:  Diagnosis Date  . Asthma   . Pregnancy induced hypertension     Past Surgical History:  Procedure Laterality Date  . JOINT REPLACEMENT      Family History  Problem Relation Age of Onset  . Hypertension Father   . Diabetes Father   . Diabetes Maternal Grandmother   . Hypertension Paternal Grandmother     Social History  Substance Use Topics  . Smoking status: Never Smoker  . Smokeless tobacco: Never Used  . Alcohol use No    Allergies: No Known  Allergies  Prescriptions Prior to Admission  Medication Sig Dispense Refill Last Dose  . aspirin EC 81 MG tablet Take 1 tablet (81 mg total) by mouth daily. 30 tablet 8 11/15/2016 at Unknown time  . cephALEXin (KEFLEX) 500 MG capsule Take 1 capsule (500 mg total) by mouth 4 (four) times daily. (Patient taking differently: Take 500 mg by mouth 4 (four) times daily. Started 11/09/16 to take for a week) 28 capsule 0 11/14/2016 at Unknown time  . Prenatal Vit-Fe Fumarate-FA (PRENATAL MULTIVITAMIN) TABS tablet Take 1 tablet by mouth daily at 12 noon.   11/14/2016 at Unknown time  . pantoprazole (PROTONIX) 40 MG tablet Take 1 tablet (40 mg total) by mouth daily. (Patient not taking: Reported on 11/15/2016) 30 tablet 2 Not Taking at Unknown time    Review of Systems - Negative except for what is mentioned in HPI.  Physical Exam   Blood pressure (!) 85/67, pulse (!) 115, temperature 98 F (36.7 C), temperature source Oral, weight 238 lb (108 kg), last menstrual period 04/27/2016, SpO2 99 %.  Constitutional: Well-developed, well-nourished female in no acute distress.  HEENT: [name]/AT, sclarea anicteric, normal oral mucosa Cardiovascular: normal rate, regular rythm Respiratory: normal effort, lungs CTAB.  GI: Abd soft, non-tender, gravid appropriate for gestational age.   MSK: Extremities nontender,  no edema, normal ROM Neurologic: Alert and oriented x 4. Psych: Normal mood and affect   FHT:  Baseline 120 , moderate variability, accelerations present, no decelerations Toco: none  MAU Course  Procedures  MDM BP here wnl. No other focal signs/symptoms.  Reactive NST Labs: Results for orders placed or performed during the hospital encounter of 11/15/16  CBC  Result Value Ref Range   WBC 11.3 (H) 4.0 - 10.5 K/uL   RBC 3.84 (L) 3.87 - 5.11 MIL/uL   Hemoglobin 10.9 (L) 12.0 - 15.0 g/dL   HCT 64.432.8 (L) 03.436.0 - 74.246.0 %   MCV 85.4 78.0 - 100.0 fL   MCH 28.4 26.0 - 34.0 pg   MCHC 33.2 30.0 - 36.0 g/dL    RDW 59.513.8 63.811.5 - 75.615.5 %   Platelets 256 150 - 400 K/uL  Comprehensive metabolic panel  Result Value Ref Range   Sodium 135 135 - 145 mmol/L   Potassium 4.3 3.5 - 5.1 mmol/L   Chloride 107 101 - 111 mmol/L   CO2 21 (L) 22 - 32 mmol/L   Glucose, Bld 68 65 - 99 mg/dL   BUN 6 6 - 20 mg/dL   Creatinine, Ser 4.330.42 (L) 0.44 - 1.00 mg/dL   Calcium 8.4 (L) 8.9 - 10.3 mg/dL   Total Protein 6.9 6.5 - 8.1 g/dL   Albumin 2.8 (L) 3.5 - 5.0 g/dL   AST 16 15 - 41 U/L   ALT 10 (L) 14 - 54 U/L   Alkaline Phosphatase 99 38 - 126 U/L   Total Bilirubin 0.3 0.3 - 1.2 mg/dL   GFR calc non Af Amer >60 >60 mL/min   GFR calc Af Amer >60 >60 mL/min   Anion gap 7 5 - 15  Protein / creatinine ratio, urine  Result Value Ref Range   Creatinine, Urine 289.00 mg/dL   Total Protein, Urine 23 mg/dL   Protein Creatinine Ratio 0.08 0.00 - 0.15 mg/mg[Cre]   UPC 0.08, CBC/CMP wnl.  One repeat BP here 142/98, all others wnl. Spoke with Dr. Ashok PallWouk about A/P. Will have patient follow up in clinic w/in 1 week. Pt to continue to monitor BP at home.   Assessment and Plan  Assessment: 1. Elevated blood pressure affecting pregnancy, antepartum    Plan: --Pt given instructions to continue to monitor BP at home, bring BP cuff to clinic, and discussed PreE precautions at length, including to come to MAU if BP 160/110 or greater. --Clinic pool messaged to schedule f/u app --Discharge home in stable condition.   Matin Mattioli, Kandra NicolasJulie P, MD 11/15/2016 1:50 PM

## 2016-11-15 NOTE — MAU Note (Signed)
Sent up from clinic, pressure was high. +HA (was given Tylenol at clinic),  Denies visual changes, epigastric pain or increased swelling.

## 2016-11-15 NOTE — Patient Instructions (Signed)
Hypertension During Pregnancy °Hypertension, commonly called high blood pressure, is when the force of blood pumping through your arteries is too strong. Arteries are blood vessels that carry blood from the heart throughout the body. Hypertension during pregnancy can cause problems for you and your baby. Your baby may be born early (prematurely) or may not weigh as much as he or she should at birth. Very bad cases of hypertension during pregnancy can be life-threatening. °Different types of hypertension can occur during pregnancy. These include: °· Chronic hypertension. This happens when: °? You have hypertension before pregnancy and it continues during pregnancy. °? You develop hypertension before you are [redacted] weeks pregnant, and it continues during pregnancy. °· Gestational hypertension. This is hypertension that develops after the 20th week of pregnancy. °· Preeclampsia, also called toxemia of pregnancy. This is a very serious type of hypertension that develops only during pregnancy. It affects the whole body, and it can be very dangerous for you and your baby. ° °Gestational hypertension and preeclampsia usually go away within 6 weeks after your baby is born. Women who have hypertension during pregnancy have a greater chance of developing hypertension later in life or during future pregnancies. °What are the causes? °The exact cause of hypertension is not known. °What increases the risk? °There are certain factors that make it more likely for you to develop hypertension during pregnancy. These include: °· Having hypertension during a previous pregnancy or prior to pregnancy. °· Being overweight. °· Being older than age 40. °· Being pregnant for the first time or being pregnant with more than one baby. °· Becoming pregnant using fertilization methods such as IVF (in vitro fertilization). °· Having diabetes, kidney problems, or systemic lupus erythematosus. °· Having a family history of hypertension. ° °What are the  signs or symptoms? °Chronic hypertension and gestational hypertension rarely cause symptoms. Preeclampsia causes symptoms, which may include: °· Increased protein in your urine. Your health care provider will check for this at every visit before you give birth (prenatal visit). °· Severe headaches. °· Sudden weight gain. °· Swelling of the hands, face, legs, and feet. °· Nausea and vomiting. °· Vision problems, such as blurred or double vision. °· Numbness in the face, arms, legs, and feet. °· Dizziness. °· Slurred speech. °· Sensitivity to bright lights. °· Abdominal pain. °· Convulsions. ° °How is this diagnosed? °You may be diagnosed with hypertension during a routine prenatal exam. At each prenatal visit, you may: °· Have a urine test to check for high amounts of protein in your urine. °· Have your blood pressure checked. A blood pressure reading is recorded as two numbers, such as "120 over 80" (or 120/80). The first ("top") number is called the systolic pressure. It is a measure of the pressure in your arteries when your heart beats. The second ("bottom") number is called the diastolic pressure. It is a measure of the pressure in your arteries as your heart relaxes between beats. Blood pressure is measured in a unit called mm Hg. A normal blood pressure reading is: °? Systolic: below 120. °? Diastolic: below 80. ° °The type of hypertension that you are diagnosed with depends on your test results and when your symptoms developed. °· Chronic hypertension is usually diagnosed before 20 weeks of pregnancy. °· Gestational hypertension is usually diagnosed after 20 weeks of pregnancy. °· Hypertension with high amounts of protein in the urine is diagnosed as preeclampsia. °· Blood pressure measurements that stay above 160 systolic, or above 110 diastolic, are   signs of severe preeclampsia. ° °How is this treated? °Treatment for hypertension during pregnancy varies depending on the type of hypertension you have and how  serious it is. °· If you take medicines called ACE inhibitors to treat chronic hypertension, you may need to switch medicines. ACE inhibitors should not be taken during pregnancy. °· If you have gestational hypertension, you may need to take blood pressure medicine. °· If you are at risk for preeclampsia, your health care provider may recommend that you take a low-dose aspirin every day to prevent high blood pressure during your pregnancy. °· If you have severe preeclampsia, you may need to be hospitalized so you and your baby can be monitored closely. You may also need to take medicine (magnesium sulfate) to prevent seizures and to lower blood pressure. This medicine may be given as an injection or through an IV tube. °· In some cases, if your condition gets worse, you may need to deliver your baby early. ° °Follow these instructions at home: °Eating and drinking °· Drink enough fluid to keep your urine clear or pale yellow. °· Eat a healthy diet that is low in salt (sodium). Do not add salt to your food. Check food labels to see how much sodium a food or beverage contains. °Lifestyle °· Do not use any products that contain nicotine or tobacco, such as cigarettes and e-cigarettes. If you need help quitting, ask your health care provider. °· Do not use alcohol. °· Avoid caffeine. °· Avoid stress as much as possible. Rest and get plenty of sleep. °General instructions °· Take over-the-counter and prescription medicines only as told by your health care provider. °· While lying down, lie on your left side. This keeps pressure off your baby. °· While sitting or lying down, raise (elevate) your feet. Try putting some pillows under your lower legs. °· Exercise regularly. Ask your health care provider what kinds of exercise are best for you. °· Keep all prenatal and follow-up visits as told by your health care provider. This is important. °Contact a health care provider if: °· You have symptoms that your health care  provider told you may require more treatment or monitoring, such as: °? Fever. °? Vomiting. °? Headache. °Get help right away if: °· You have severe abdominal pain or vomiting that does not get better with treatment. °· You suddenly develop swelling in your hands, ankles, or face. °· You gain 4 lbs (1.8 kg) or more in 1 week. °· You develop vaginal bleeding, or you have blood in your urine. °· You do not feel your baby moving as much as usual. °· You have blurred or double vision. °· You have muscle twitching or sudden tightening (spasms). °· You have shortness of breath. °· Your lips or fingernails turn blue. °This information is not intended to replace advice given to you by your health care provider. Make sure you discuss any questions you have with your health care provider. °Document Released: 11/16/2010 Document Revised: 09/18/2015 Document Reviewed: 08/14/2015 °Elsevier Interactive Patient Education © 2018 [employer] ° °

## 2016-11-15 NOTE — Progress Notes (Signed)
   PRENATAL VISIT NOTE  Subjective:  Breanna Carrillo is a 21 y.o. G2P1001 at 47w6dbeing seen today for ongoing prenatal care.  She is currently monitored for the following issues for this low-risk pregnancy and has Hyperemesis gravidarum; Supervision of normal pregnancy in first trimester; Obesity affecting pregnancy, antepartum; Hypercholesterolemia; Obesity (BMI 35.0-39.9 without comorbidity); and Hx of preeclampsia, prior pregnancy, currently pregnant on her problem list.  Patient reports intermittent HA's x 3 weeks.  Improve w/ Tylenol most of the time. Rates HA 2/10 now, 7/10 at worst. Describes HA as behind both eyes and wrapping behind ears, throbbing, worse at night. Denies vision changes or epigastric pain, but is having ongoing heartburn that started around 20 weeks that used to resolve w/ Zantac. Zantac no longer working.  Contractions: Not present. Vag. Bleeding: None.  Movement: Present. Denies leaking of fluid.   The following portions of the patient's history were reviewed and updated as appropriate: allergies, current medications, past family history, past medical history, past social history, past surgical history and problem list. Problem list updated.  Objective:   Vitals:   11/15/16 0821 11/15/16 0827 11/15/16 0927 11/15/16 0948  BP: (!) 112/99 124/63 119/70 (!) 129/102  Pulse: 81  95 98    Fetal Status: Fetal Heart Rate (bpm): 128   Movement: Present   Urine Protein: Negative  General:  Alert, oriented and cooperative. Patient is in no acute distress.  Skin: Skin is warm and dry. No rash noted.   Cardiovascular: Normal heart rate noted  Respiratory: Normal respiratory effort, no problems with respiration noted  Abdomen: Soft, gravid, appropriate for gestational age.  Pain/Pressure: Absent     Pelvic: Cervical exam deferred        Extremities: Normal range of motion.  Edema: None  Mental Status:  Normal mood and affect. Normal behavior. Normal judgment and thought  content.   Assessment and Plan:  Pregnancy: G2P1001 at 215w6d1. Supervision of low-risk pregnancy, third trimester  - Glucose Tolerance, 2 Hours w/1 Hour - HIV antibody (with reflex) - Tdap vaccine greater than or equal to 7yo IM - RPR - POCT urinalysis dip (device) - CBC - Comp Met (CMET) - Protein / creatinine ratio, urine  2. Need for Tdap vaccination  - Tdap vaccine greater than or equal to 7yo IM  3. Hypertension affecting pregnancy in third trimester  - CBC - Comp Met (CMET) - Protein / creatinine ratio, urine - Tylenol 1 gm  - Labs drawn STAT, Tylenol and BPP's done in clinic 2/2 pt in middle of GTT. Will send to [facility] Afterward per consult w/ Dr. WoSi Raideror continued BPP monitoring and awaiting Pre-E lab results.   4. Gastroesophageal reflux during pregnancy in third trimester, antepartum  - pantoprazole (PROTONIX) 40 MG tablet; Take 1 tablet (40 mg total) by mouth daily.  Dispense: 30 tablet; Refill: 2  C/O nausea after Glucola. Zofran ODT given.  Preterm labor symptoms and general obstetric precautions including but not limited to vaginal bleeding, contractions, leaking of fluid and fetal movement were reviewed in detail with the patient. Please refer to After Visit Summary for other counseling recommendations.  Return in about 3 days (around 11/18/2016).   ViManya SilvasCNM

## 2016-11-15 NOTE — [facility] Note (Signed)
Urine sent to lab 

## 2016-11-15 NOTE — Progress Notes (Signed)
Maternal heart tracing @ times

## 2016-11-16 LAB — GLUCOSE TOLERANCE, 2 HOURS W/ 1HR
GLUCOSE, 2 HOUR: 107 mg/dL (ref 65–152)
Glucose, 1 hour: 147 mg/dL (ref 65–179)
Glucose, Fasting: 87 mg/dL (ref 65–91)

## 2016-11-16 LAB — RPR: RPR Ser Ql: NONREACTIVE

## 2016-11-16 LAB — HIV ANTIBODY (ROUTINE TESTING W REFLEX): HIV SCREEN 4TH GENERATION: NONREACTIVE

## 2016-11-18 ENCOUNTER — Ambulatory Visit: Payer: Self-pay

## 2016-11-18 ENCOUNTER — Telehealth: Payer: Self-pay | Admitting: *Deleted

## 2016-11-18 NOTE — Telephone Encounter (Signed)
Breanna Carrillo did not keep her schedule bp check appointment today. I called Breanna Carrillo and she states she didn't know she had an appointment today, just Tuesday 11/22/16 at 2pm for ob visit. Since we are closing today I advised her to keep her Tuesday appt . She states her bp is still running about the same at about 140/90. I reviewed her discharge instructions with her which were to come to [facility] if bp 160/110 or headache unrelieved by tylenol, increased edema, etc. She voices understanding.

## 2016-11-22 ENCOUNTER — Encounter: Payer: Self-pay | Admitting: Obstetrics and Gynecology

## 2016-11-22 ENCOUNTER — Ambulatory Visit (INDEPENDENT_AMBULATORY_CARE_PROVIDER_SITE_OTHER): Payer: Medicaid Other | Admitting: Obstetrics and Gynecology

## 2016-11-22 VITALS — BP 107/54 | HR 103 | Wt 229.8 lb

## 2016-11-22 DIAGNOSIS — O09293 Supervision of pregnancy with other poor reproductive or obstetric history, third trimester: Secondary | ICD-10-CM

## 2016-11-22 DIAGNOSIS — O133 Gestational [pregnancy-induced] hypertension without significant proteinuria, third trimester: Secondary | ICD-10-CM

## 2016-11-22 DIAGNOSIS — O0990 Supervision of high risk pregnancy, unspecified, unspecified trimester: Secondary | ICD-10-CM

## 2016-11-22 DIAGNOSIS — O09299 Supervision of pregnancy with other poor reproductive or obstetric history, unspecified trimester: Secondary | ICD-10-CM

## 2016-11-22 DIAGNOSIS — O0993 Supervision of high risk pregnancy, unspecified, third trimester: Secondary | ICD-10-CM

## 2016-11-22 DIAGNOSIS — O139 Gestational [pregnancy-induced] hypertension without significant proteinuria, unspecified trimester: Secondary | ICD-10-CM | POA: Insufficient documentation

## 2016-11-22 NOTE — Progress Notes (Signed)
   PRENATAL VISIT NOTE  Subjective:  Breanna Carrillo is a 21 y.o. G2P1001 at 820w6d being seen today for ongoing prenatal care.  She is currently monitored for the following issues for this low-risk pregnancy and has Hyperemesis gravidarum; Supervision of normal pregnancy in first trimester; Obesity affecting pregnancy, antepartum; Hypercholesterolemia; Obesity (BMI 35.0-39.9 without comorbidity); Hx of preeclampsia, prior pregnancy, currently pregnant; and Gestational hypertension on her problem list.  Patient reports Pressure off and on..  Contractions: Not present. Vag. Bleeding: None.  Movement: Present. Denies leaking of fluid. Denies vaginal/cervical exam.   The following portions of the patient's history were reviewed and updated as appropriate: allergies, current medications, past family history, past medical history, past social history, past surgical history and problem list. Problem list updated.  Objective:   Vitals:   11/22/16 1507  BP: (!) 107/54  Pulse: (!) 103  Weight: 104.2 kg (229 lb 12.8 oz)    Fetal Status: Fetal Heart Rate (bpm): 136   Movement: Present     General:  Alert, oriented and cooperative. Patient is in no acute distress.  Skin: Skin is warm and dry. No rash noted.   Cardiovascular: Normal heart rate noted  Respiratory: Normal respiratory effort, no problems with respiration noted  Abdomen: Soft, gravid, appropriate for gestational age.  Pain/Pressure: Present     Pelvic: Cervical exam deferred        Extremities: Normal range of motion.  Edema: Trace  Mental Status:  Normal mood and affect. Normal behavior. Normal judgment and thought content.   Assessment and Plan:  Pregnancy: G2P1001 at 420w6d  1. Supervision of high risk pregnancy, antepartum Doing well.   2. Pregnancy-induced hypertension in third trimester Blood pressure today normal. No HA  Abnormal BP as follows:  11/09/16: 144/97  11/15/16: 112/99 & 129/102 BPP in the next week. Unclear  diagnoses of HTN, reviewed chart in detail and the patient was called at home. Message sent to schedule BPP ASAP and Antenatal testing starting at 32 weeks. Delivery @ 37 weeks.  Recent PIH labs WNL.   3. Hx of preeclampsia, prior pregnancy, currently pregnant Taking ASA, continue ASA daily    There are no diagnoses linked to this encounter. Preterm labor symptoms and general obstetric precautions including but not limited to vaginal bleeding, contractions, leaking of fluid and fetal movement were reviewed in detail with the patient. Please refer to After Visit Summary for other counseling recommendations.  Return in about 1 week (around 11/29/2016) for For BPP.   Venia CarbonJennifer Aleyda Gindlesperger, NP

## 2016-11-29 ENCOUNTER — Encounter: Payer: Self-pay | Admitting: Obstetrics & Gynecology

## 2016-11-29 ENCOUNTER — Ambulatory Visit (INDEPENDENT_AMBULATORY_CARE_PROVIDER_SITE_OTHER): Payer: Medicaid Other | Admitting: *Deleted

## 2016-11-29 ENCOUNTER — Ambulatory Visit (INDEPENDENT_AMBULATORY_CARE_PROVIDER_SITE_OTHER): Payer: Medicaid Other | Admitting: Obstetrics & Gynecology

## 2016-11-29 ENCOUNTER — Ambulatory Visit: Payer: Self-pay

## 2016-11-29 ENCOUNTER — Ambulatory Visit: Payer: Medicaid Other

## 2016-11-29 VITALS — BP 132/55 | HR 119 | Wt 230.8 lb

## 2016-11-29 DIAGNOSIS — O0990 Supervision of high risk pregnancy, unspecified, unspecified trimester: Secondary | ICD-10-CM

## 2016-11-29 DIAGNOSIS — O133 Gestational [pregnancy-induced] hypertension without significant proteinuria, third trimester: Secondary | ICD-10-CM | POA: Diagnosis present

## 2016-11-29 DIAGNOSIS — O26893 Other specified pregnancy related conditions, third trimester: Secondary | ICD-10-CM

## 2016-11-29 DIAGNOSIS — O139 Gestational [pregnancy-induced] hypertension without significant proteinuria, unspecified trimester: Secondary | ICD-10-CM

## 2016-11-29 DIAGNOSIS — M25559 Pain in unspecified hip: Secondary | ICD-10-CM

## 2016-11-29 LAB — POCT URINALYSIS DIP (DEVICE)
Glucose, UA: NEGATIVE mg/dL
HGB URINE DIPSTICK: NEGATIVE
NITRITE: NEGATIVE
PH: 6 (ref 5.0–8.0)
PROTEIN: 30 mg/dL — AB
Specific Gravity, Urine: >=1.03 (ref 1.005–1.030)
UROBILINOGEN UA: 1 mg/dL (ref 0.0–1.0)

## 2016-11-29 MED ORDER — CYCLOBENZAPRINE HCL 10 MG PO TABS: 10.0000 mg | ORAL_TABLET | Freq: Three times a day (TID) | ORAL | 2 refills | Status: DC | PRN

## 2016-11-29 NOTE — Patient Instructions (Signed)
Return to clinic for any scheduled appointments or obstetric concerns, or go to [facility] for evaluation  

## 2016-11-29 NOTE — Progress Notes (Signed)
   PRENATAL VISIT NOTE  Subjective:  Breanna Carrillo is a 21 y.o. G2P1001 at [redacted]w[redacted]d being seen today for ongoing prenatal care.  She is currently monitored for the following issues for this high-risk pregnancy and has Hyperemesis gravidarum; Supervision of high risk pregnancy, antepartum; Obesity affecting pregnancy, antepartum; Hypercholesterolemia; Obesity (BMI 35.0-39.9 without comorbidity); Hx of preeclampsia, prior pregnancy, currently pregnant; Gestational hypertension; and Gestational hypertension, antepartum on her problem list.  Patient reports no complaints.  Contractions: Irregular. Vag. Bleeding: None.  Movement: Present. Denies leaking of fluid.   The following portions of the patient's history were reviewed and updated as appropriate: allergies, current medications, past family history, past medical history, past social history, past surgical history and problem list. Problem list updated.  Objective:   Vitals:   11/29/16 1254  BP: (!) 132/55  Pulse: (!) 119  Weight: 230 lb 12.8 oz (104.7 kg)    Fetal Status: Fetal Heart Rate (bpm): NST   Movement: Present     General:  Alert, oriented and cooperative. Patient is in no acute distress.  Skin: Skin is warm and dry. No rash noted.   Cardiovascular: Normal heart rate noted  Respiratory: Normal respiratory effort, no problems with respiration noted  Abdomen: Soft, gravid, appropriate for gestational age.  Pain/Pressure: Present     Pelvic: Cervical exam deferred        Extremities: Normal range of motion.     Mental Status:  Normal mood and affect. Normal behavior. Normal judgment and thought content.   BPP 8/10 (-no breathing)  Assessment and Plan:  Pregnancy: G2P1001 at [redacted]w[redacted]d  1. Gestational hypertension, antepartum Normal BP.Continue antenatal testing. Growth scan ordered. - [place] MFM OB FOLLOW UP; Future - [place] [facility] FETAL BPP WO NON STRESS; Future  2. Pregnancy related hip pain in third trimester, antepartum Recommended  Tylenol with Flexeril as needed - cyclobenzaprine (FLEXERIL) 10 MG tablet; Take 1 tablet (10 mg total) by mouth every 8 (eight) hours as needed for muscle spasms.  Dispense: 30 tablet; Refill: 2  3. Supervision of high risk pregnancy, antepartum Preterm labor symptoms and general obstetric precautions including but not limited to vaginal bleeding, contractions, leaking of fluid and fetal movement were reviewed in detail with the patient. Please refer to After Visit Summary for other counseling recommendations.  Return in about 1 week (around 12/06/2016) for NST/BPP & HOB.   Jaynie Collins, MD

## 2016-11-29 NOTE — Progress Notes (Signed)

## 2016-12-07 ENCOUNTER — Encounter: Payer: Self-pay | Admitting: Family Medicine

## 2016-12-07 ENCOUNTER — Ambulatory Visit (HOSPITAL_COMMUNITY)
Admission: RE | Admit: 2016-12-07 | Discharge: 2016-12-07 | Disposition: A | Discharge status: Home / Self Care | Payer: Medicaid Other | Source: Ambulatory Visit | Attending: Obstetrics & Gynecology | Admitting: Obstetrics & Gynecology

## 2016-12-07 ENCOUNTER — Encounter (HOSPITAL_COMMUNITY): Payer: Self-pay

## 2016-12-07 ENCOUNTER — Other Ambulatory Visit: Payer: Self-pay | Admitting: Obstetrics & Gynecology

## 2016-12-07 ENCOUNTER — Other Ambulatory Visit (HOSPITAL_COMMUNITY): Payer: Self-pay | Admitting: *Deleted

## 2016-12-07 ENCOUNTER — Encounter: Payer: Medicaid Other | Admitting: Advanced Practice Midwife

## 2016-12-07 ENCOUNTER — Ambulatory Visit (INDEPENDENT_AMBULATORY_CARE_PROVIDER_SITE_OTHER): Payer: Medicaid Other | Admitting: *Deleted

## 2016-12-07 ENCOUNTER — Ambulatory Visit (INDEPENDENT_AMBULATORY_CARE_PROVIDER_SITE_OTHER): Payer: Medicaid Other | Admitting: Family Medicine

## 2016-12-07 VITALS — BP 106/58 | HR 122 | Wt 235.3 lb

## 2016-12-07 DIAGNOSIS — O133 Gestational [pregnancy-induced] hypertension without significant proteinuria, third trimester: Secondary | ICD-10-CM | POA: Diagnosis present

## 2016-12-07 DIAGNOSIS — O09293 Supervision of pregnancy with other poor reproductive or obstetric history, third trimester: Secondary | ICD-10-CM | POA: Diagnosis present

## 2016-12-07 DIAGNOSIS — O0993 Supervision of high risk pregnancy, unspecified, third trimester: Secondary | ICD-10-CM

## 2016-12-07 DIAGNOSIS — O0990 Supervision of high risk pregnancy, unspecified, unspecified trimester: Secondary | ICD-10-CM

## 2016-12-07 DIAGNOSIS — O139 Gestational [pregnancy-induced] hypertension without significant proteinuria, unspecified trimester: Secondary | ICD-10-CM

## 2016-12-07 DIAGNOSIS — Z3A32 32 weeks gestation of pregnancy: Secondary | ICD-10-CM

## 2016-12-07 LAB — POCT URINALYSIS DIP (DEVICE)
BILIRUBIN URINE: NEGATIVE
Glucose, UA: 100 mg/dL — AB
HGB URINE DIPSTICK: NEGATIVE
Nitrite: NEGATIVE
PH: 6 (ref 5.0–8.0)
PROTEIN: NEGATIVE mg/dL
Urobilinogen, UA: 2 mg/dL — ABNORMAL HIGH (ref 0.0–1.0)

## 2016-12-07 NOTE — Progress Notes (Signed)
US for growth and BPP done today 

## 2016-12-07 NOTE — Patient Instructions (Signed)
Breastfeeding Deciding to breastfeed is one of the best choices you can make for you and your baby. A change in hormones during pregnancy causes your breast tissue to grow and increases the number and size of your milk ducts. These hormones also allow proteins, sugars, and fats from your blood supply to make breast milk in your milk-producing glands. Hormones prevent breast milk from being released before your baby is born as well as prompt milk flow after birth. Once breastfeeding has begun, thoughts of your baby, as well as his or her sucking or crying, can stimulate the release of milk from your milk-producing glands. Benefits of breastfeeding For Your Baby  Your first milk (colostrum) helps your baby's digestive system function better.  There are antibodies in your milk that help your baby fight off infections.  Your baby has a lower incidence of asthma, allergies, and sudden infant death syndrome.  The nutrients in breast milk are better for your baby than infant formulas and are designed uniquely for your baby's needs.  Breast milk improves your baby's brain development.  Your baby is less likely to develop other conditions, such as childhood obesity, asthma, or type 2 diabetes mellitus.  For You  Breastfeeding helps to create a very special bond between you and your baby.  Breastfeeding is convenient. Breast milk is always available at the correct temperature and costs nothing.  Breastfeeding helps to burn calories and helps you lose the weight gained during pregnancy.  Breastfeeding makes your uterus contract to its prepregnancy size faster and slows bleeding (lochia) after you give birth.  Breastfeeding helps to lower your risk of developing type 2 diabetes mellitus, osteoporosis, and breast or ovarian cancer later in life.  Signs that your baby is hungry Early Signs of Hunger  Increased alertness or activity.  Stretching.  Movement of the head from side to  side.  Movement of the head and opening of the mouth when the corner of the mouth or cheek is stroked (rooting).  Increased sucking sounds, smacking lips, cooing, sighing, or squeaking.  Hand-to-mouth movements.  Increased sucking of fingers or hands.  Late Signs of Hunger  Fussing.  Intermittent crying.  Extreme Signs of Hunger Signs of extreme hunger will require calming and consoling before your baby will be able to breastfeed successfully. Do not wait for the following signs of extreme hunger to occur before you initiate breastfeeding:  Restlessness.  A loud, strong cry.  Screaming.  Breastfeeding basics Breastfeeding Initiation  Find a comfortable place to sit or lie down, with your neck and back well supported.  Place a pillow or rolled up blanket under your baby to bring him or her to the level of your breast (if you are seated). Nursing pillows are specially designed to help support your arms and your baby while you breastfeed.  Make sure that your baby's abdomen is facing your abdomen.  Gently massage your breast. With your fingertips, massage from your chest wall toward your nipple in a circular motion. This encourages milk flow. You may need to continue this action during the feeding if your milk flows slowly.  Support your breast with 4 fingers underneath and your thumb above your nipple. Make sure your fingers are well away from your nipple and your baby's mouth.  Stroke your baby's lips gently with your finger or nipple.  When your baby's mouth is open wide enough, quickly bring your baby to your breast, placing your entire nipple and as much of the colored area   around your nipple (areola) as possible into your baby's mouth. ? More areola should be visible above your baby's upper lip than below the lower lip. ? Your baby's tongue should be between his or her lower gum and your breast.  Ensure that your baby's mouth is correctly positioned around your nipple  (latched). Your baby's lips should create a seal on your breast and be turned out (everted).  It is common for your baby to suck about 2-3 minutes in order to start the flow of breast milk.  Latching Teaching your baby how to latch on to your breast properly is very important. An improper latch can cause nipple pain and decreased milk supply for you and poor weight gain in your baby. Also, if your baby is not latched onto your nipple properly, he or she may swallow some air during feeding. This can make your baby fussy. Burping your baby when you switch breasts during the feeding can help to get rid of the air. However, teaching your baby to latch on properly is still the best way to prevent fussiness from swallowing air while breastfeeding. Signs that your baby has successfully latched on to your nipple:  Silent tugging or silent sucking, without causing you pain.  Swallowing heard between every 3-4 sucks.  Muscle movement above and in front of his or her ears while sucking.  Signs that your baby has not successfully latched on to nipple:  Sucking sounds or smacking sounds from your baby while breastfeeding.  Nipple pain.  If you think your baby has not latched on correctly, slip your finger into the corner of your baby's mouth to break the suction and place it between your baby's gums. Attempt breastfeeding initiation again. Signs of Successful Breastfeeding Signs from your baby:  A gradual decrease in the number of sucks or complete cessation of sucking.  Falling asleep.  Relaxation of his or her body.  Retention of a small amount of milk in his or her mouth.  Letting go of your breast by himself or herself.  Signs from you:  Breasts that have increased in firmness, weight, and size 1-3 hours after feeding.  Breasts that are softer immediately after breastfeeding.  Increased milk volume, as well as a change in milk consistency and color by the fifth day of  breastfeeding.  Nipples that are not sore, cracked, or bleeding.  Signs That Your Baby is Getting Enough Milk  Wetting at least 1-2 diapers during the first 24 hours after birth.  Wetting at least 5-6 diapers every 24 hours for the first week after birth. The urine should be clear or pale yellow by 5 days after birth.  Wetting 6-8 diapers every 24 hours as your baby continues to grow and develop.  At least 3 stools in a 24-hour period by age 5 days. The stool should be soft and yellow.  At least 3 stools in a 24-hour period by age 7 days. The stool should be seedy and yellow.  No loss of weight greater than 10% of birth weight during the first 3 days of age.  Average weight gain of 4-7 ounces (113-198 g) per week after age 4 days.  Consistent daily weight gain by age 5 days, without weight loss after the age of 2 weeks.  After a feeding, your baby may spit up a small amount. This is common. Breastfeeding frequency and duration Frequent feeding will help you make more milk and can prevent sore nipples and breast engorgement. Breastfeed when   you feel the need to reduce the fullness of your breasts or when your baby shows signs of hunger. This is called "breastfeeding on demand." Avoid introducing a pacifier to your baby while you are working to establish breastfeeding (the first 4-6 weeks after your baby is born). After this time you may choose to use a pacifier. Research has shown that pacifier use during the first year of a baby's life decreases the risk of sudden infant death syndrome (SIDS). Allow your baby to feed on each breast as long as he or she wants. Breastfeed until your baby is finished feeding. When your baby unlatches or falls asleep while feeding from the first breast, offer the second breast. Because newborns are often sleepy in the first few weeks of life, you may need to awaken your baby to get him or her to feed. Breastfeeding times will vary from baby to baby. However,  the following rules can serve as a guide to help you ensure that your baby is properly fed:  Newborns (babies 4 weeks of age or younger) may breastfeed every 1-3 hours.  Newborns should not go longer than 3 hours during the day or 5 hours during the night without breastfeeding.  You should breastfeed your baby a minimum of 8 times in a 24-hour period until you begin to introduce solid foods to your baby at around 6 months of age.  Breast milk pumping Pumping and storing breast milk allows you to ensure that your baby is exclusively fed your breast milk, even at times when you are unable to breastfeed. This is especially important if you are going back to work while you are still breastfeeding or when you are not able to be present during feedings. Your lactation consultant can give you guidelines on how long it is safe to store breast milk. A breast pump is a machine that allows you to pump milk from your breast into a sterile bottle. The pumped breast milk can then be stored in a refrigerator or freezer. Some breast pumps are operated by hand, while others use electricity. Ask your lactation consultant which type will work best for you. Breast pumps can be purchased, but some hospitals and breastfeeding support groups lease breast pumps on a monthly basis. A lactation consultant can teach you how to hand express breast milk, if you prefer not to use a pump. Caring for your breasts while you breastfeed Nipples can become dry, cracked, and sore while breastfeeding. The following recommendations can help keep your breasts moisturized and healthy:  Avoid using soap on your nipples.  Wear a supportive bra. Although not required, special nursing bras and tank tops are designed to allow access to your breasts for breastfeeding without taking off your entire bra or top. Avoid wearing underwire-style bras or extremely tight bras.  Air dry your nipples for 3-4minutes after each feeding.  Use only cotton  bra pads to absorb leaked breast milk. Leaking of breast milk between feedings is normal.  Use lanolin on your nipples after breastfeeding. Lanolin helps to maintain your skin's normal moisture barrier. If you use pure lanolin, you do not need to wash it off before feeding your baby again. Pure lanolin is not toxic to your baby. You may also hand express a few drops of breast milk and gently massage that milk into your nipples and allow the milk to air dry.  In the first few weeks after giving birth, some women experience extremely full breasts (engorgement). Engorgement can make your   breasts feel heavy, warm, and tender to the touch. Engorgement peaks within 3-5 days after you give birth. The following recommendations can help ease engorgement:  Completely empty your breasts while breastfeeding or pumping. You may want to start by applying warm, moist heat (in the shower or with warm water-soaked hand towels) just before feeding or pumping. This increases circulation and helps the milk flow. If your baby does not completely empty your breasts while breastfeeding, pump any extra milk after he or she is finished.  Wear a snug bra (nursing or regular) or tank top for 1-2 days to signal your body to slightly decrease milk production.  Apply ice packs to your breasts, unless this is too uncomfortable for you.  Make sure that your baby is latched on and positioned properly while breastfeeding.  If engorgement persists after 48 hours of following these recommendations, contact your health care provider or a lactation consultant. Overall health care recommendations while breastfeeding  Eat healthy foods. Alternate between meals and snacks, eating 3 of each per day. Because what you eat affects your breast milk, some of the foods may make your baby more irritable than usual. Avoid eating these foods if you are sure that they are negatively affecting your baby.  Drink milk, fruit juice, and water to  satisfy your thirst (about 10 glasses a day).  Rest often, relax, and continue to take your prenatal vitamins to prevent fatigue, stress, and anemia.  Continue breast self-awareness checks.  Avoid chewing and smoking tobacco. Chemicals from cigarettes that pass into breast milk and exposure to secondhand smoke may harm your baby.  Avoid alcohol and drug use, including marijuana. Some medicines that may be harmful to your baby can pass through breast milk. It is important to ask your health care provider before taking any medicine, including all over-the-counter and prescription medicine as well as vitamin and herbal supplements. It is possible to become pregnant while breastfeeding. If birth control is desired, ask your health care provider about options that will be safe for your baby. Contact a health care provider if:  You feel like you want to stop breastfeeding or have become frustrated with breastfeeding.  You have painful breasts or nipples.  Your nipples are cracked or bleeding.  Your breasts are red, tender, or warm.  You have a swollen area on either breast.  You have a fever or chills.  You have nausea or vomiting.  You have drainage other than breast milk from your nipples.  Your breasts do not become full before feedings by the fifth day after you give birth.  You feel sad and depressed.  Your baby is too sleepy to eat well.  Your baby is having trouble sleeping.  Your baby is wetting less than 3 diapers in a 24-hour period.  Your baby has less than 3 stools in a 24-hour period.  Your baby's skin or the white part of his or her eyes becomes yellow.  Your baby is not gaining weight by 5 days of age. Get help right away if:  Your baby is overly tired (lethargic) and does not want to wake up and feed.  Your baby develops an unexplained fever. This information is not intended to replace advice given to you by your health care provider. Make sure you discuss  any questions you have with your health care provider. Document Released: 02/28/2005 Document Revised: 08/12/2015 Document Reviewed: 08/22/2012 Elsevier Interactive Patient Education  2017 [employer]  

## 2016-12-07 NOTE — Progress Notes (Signed)
    PRENATAL VISIT NOTE  Subjective:  Breanna Carrillo is a 21 y.o. G2P1001 at 107w0d being seen today for ongoing prenatal care.  She is currently monitored for the following issues for this high-risk pregnancy and has Hyperemesis gravidarum; Supervision of high risk pregnancy, antepartum; Obesity affecting pregnancy, antepartum; Hypercholesterolemia; Obesity (BMI 35.0-39.9 without comorbidity); Hx of preeclampsia, prior pregnancy, currently pregnant; and Gestational hypertension, antepartum on her problem list.  Patient reports no complaints.  Contractions: Irregular. Vag. Bleeding: None.  Movement: Present. Denies leaking of fluid.   The following portions of the patient's history were reviewed and updated as appropriate: allergies, current medications, past family history, past medical history, past social history, past surgical history and problem list. Problem list updated.  Objective:   Vitals:   12/07/16 1104  BP: (!) 106/58  Pulse: (!) 122  Weight: 235 lb 4.8 oz (106.7 kg)    Fetal Status: Fetal Heart Rate (bpm): NST   Movement: Present     General:  Alert, oriented and cooperative. Patient is in no acute distress.  Skin: Skin is warm and dry. No rash noted.   Cardiovascular: Normal heart rate noted  Respiratory: Normal respiratory effort, no problems with respiration noted  Abdomen: Soft, gravid, appropriate for gestational age.  Pain/Pressure: Present     Pelvic: Cervical exam deferred        Extremities: Normal range of motion.  Edema: None  Mental Status:  Normal mood and affect. Normal behavior. Normal judgment and thought content.  U/S vtx, AFI WNL, EFW 2297 gms (5 lb 1 oz) 83%, AC > 97%., BPP 8/8 NST:  Baseline: 135 bpm, Variability: Good {> 6 bpm), Accelerations: Reactive and Decelerations: Absent   Assessment and Plan:  Pregnancy: G2P1001 at [redacted]w[redacted]d  1. Supervision of high risk pregnancy, antepartum Continue prenatal care.   2. Gestational hypertension,  antepartum Continue weekly BPPs  Preterm labor symptoms and general obstetric precautions including but not limited to vaginal bleeding, contractions, leaking of fluid and fetal movement were reviewed in detail with the patient. Please refer to After Visit Summary for other counseling recommendations.  Return in about 7 days (around 12/14/2016) for 10/2, 10/3 or 10/4 for NST/BPP and HOB.   Reva Bores, MD

## 2016-12-13 ENCOUNTER — Inpatient Hospital Stay
Admission: EM | Admit: 2016-12-13 | Discharge: 2016-12-14 | Disposition: A | Discharge status: Home / Self Care | Payer: Medicaid Other | Attending: Advanced Practice Midwife | Admitting: Advanced Practice Midwife

## 2016-12-13 ENCOUNTER — Encounter: Payer: Self-pay | Admitting: *Deleted

## 2016-12-13 DIAGNOSIS — O26893 Other specified pregnancy related conditions, third trimester: Secondary | ICD-10-CM | POA: Insufficient documentation

## 2016-12-13 DIAGNOSIS — R102 Pelvic and perineal pain: Secondary | ICD-10-CM | POA: Insufficient documentation

## 2016-12-13 DIAGNOSIS — O99210 Obesity complicating pregnancy, unspecified trimester: Secondary | ICD-10-CM

## 2016-12-13 DIAGNOSIS — Z3A32 32 weeks gestation of pregnancy: Secondary | ICD-10-CM | POA: Insufficient documentation

## 2016-12-13 DIAGNOSIS — Z7982 Long term (current) use of aspirin: Secondary | ICD-10-CM | POA: Insufficient documentation

## 2016-12-13 DIAGNOSIS — O0990 Supervision of high risk pregnancy, unspecified, unspecified trimester: Secondary | ICD-10-CM

## 2016-12-13 DIAGNOSIS — O210 Mild hyperemesis gravidarum: Secondary | ICD-10-CM

## 2016-12-13 NOTE — OB Triage Note (Signed)
Discharge instructions provided and reviewed.  Follow up care discussed.  All concerns addressed.   

## 2016-12-13 NOTE — OB Triage Note (Signed)
Pt arrived to triage with c/o pelvic pain upon awakening this am.  Also states decreased fetal movement.  Last felt baby move around 2100.  Pt denies vaginal bleeding and leaking of fluid.  EFM and toco applied and tracing.

## 2016-12-13 NOTE — Discharge Summary (Signed)
Physician Final Progress Note  Patient ID: Breanna Carrillo MRN: [id number] DOB/AGE: Feb 03, 1996 21 y.o.  Admit date: 12/13/2016 Admitting provider: Nadara Mustard, MD Discharge date: 12/13/2016   Admission Diagnoses: decreased fetal movement, pain in pelvis this morning  Discharge Diagnoses:  Active Problems:   * No active hospital problems. * IUP at [redacted]w[redacted]d, reactive NST, positive fetal movement, pubic symphysis pain  History of Present Illness: The patient is a 21 y.o. female G2P1001 at [redacted]w[redacted]d who presents for decreased fetal movement over the course of the day. She has felt baby "shift but no kicks". She has felt the pelvic pain focused over the pubic bone a couple of times during the day. She denies contractions, burning with urination, pressure, leakage of fluid or vaginal bleeding. She has had a headache that is relieved by tylenol.  Past Medical History:  Diagnosis Date  . Asthma   . Pregnancy induced hypertension     Past Surgical History:  Procedure Laterality Date  . JOINT REPLACEMENT      No current facility-administered medications on file prior to encounter.    Current Outpatient Prescriptions on File Prior to Encounter  Medication Sig Dispense Refill  . cyclobenzaprine (FLEXERIL) 10 MG tablet Take 1 tablet (10 mg total) by mouth every 8 (eight) hours as needed for muscle spasms. 30 tablet 2  . pantoprazole (PROTONIX) 40 MG tablet Take 1 tablet (40 mg total) by mouth daily. 30 tablet 2  . Prenatal Vit-Fe Fumarate-FA (PRENATAL MULTIVITAMIN) TABS tablet Take 1 tablet by mouth daily at 12 noon.    Marland Kitchen aspirin EC 81 MG tablet Take 1 tablet (81 mg total) by mouth daily. 30 tablet 8    No Known Allergies  Social History   Social History  . Marital status: Single    Spouse name: N/A  . Number of children: N/A  . Years of education: N/A   Occupational History  . Not on file.   Social History Main Topics  . Smoking status: Never Smoker  . Smokeless tobacco: Never  Used  . Alcohol use No  . Drug use: No  . Sexual activity: Yes   Other Topics Concern  . Not on file   Social History Narrative  . No narrative on file    Physical Exam: BP 119/74 (BP Location: Left Arm)   Pulse (!) 119   Temp (!) 97.5 F (36.4 C) (Oral)   Resp 18   Ht  (1.702 m)   Wt 235 lb (106.6 kg)   LMP 04/27/2016   BMI 36.81 kg/m   Gen: NAD CV: RRR Pulm: CTAB Pelvic: deferred Toco: negative for contractions Fetal well being: 135 bpm, moderate variability, +accelerations, -decelerations Ext: trace edema  Consults: None  Significant Findings/ Diagnostic Studies: none  Procedures: NST  Discharge Condition: good  Disposition: 01-Home or Self Care  Diet: Regular diet  Discharge Activity: Activity as tolerated  Discharge Instructions    Discharge activity:  No Restrictions    Complete by:  As directed    Discharge diet:  No restrictions    Complete by:  As directed    Fetal Kick Count:  Lie on our left side for one hour after a meal, and count the number of times your baby kicks.  If it is less than 5 times, get up, move around and drink some juice.  Repeat the test 30 minutes later.  If it is still less than 5 kicks in an hour, notify your doctor.  Complete by:  As directed    No sexual activity restrictions    Complete by:  As directed    Notify physician for a general feeling that "something is not right"    Complete by:  As directed    Notify physician for increase or change in vaginal discharge    Complete by:  As directed    Notify physician for intestinal cramps, with or without diarrhea, sometimes described as "gas pain"    Complete by:  As directed    Notify physician for leaking of fluid    Complete by:  As directed    Notify physician for low, dull backache, unrelieved by heat or Tylenol    Complete by:  As directed    Notify physician for menstrual like cramps    Complete by:  As directed    Notify physician for pelvic pressure     Complete by:  As directed    Notify physician for uterine contractions.  These may be painless and feel like the uterus is tightening or the baby is  "balling up"    Complete by:  As directed    Notify physician for vaginal bleeding    Complete by:  As directed    PRETERM LABOR:  Includes any of the follwing symptoms that occur between 20 - [redacted] weeks gestation.  If these symptoms are not stopped, preterm labor can result in preterm delivery, placing your baby at risk    Complete by:  As directed      Allergies as of 12/13/2016   No Known Allergies     Medication List    TAKE these medications   aspirin EC 81 MG tablet Take 1 tablet (81 mg total) by mouth daily.   cyclobenzaprine 10 MG tablet Commonly known as:  FLEXERIL Take 1 tablet (10 mg total) by mouth every 8 (eight) hours as needed for muscle spasms.   pantoprazole 40 MG tablet Commonly known as:  PROTONIX Take 1 tablet (40 mg total) by mouth daily.   prenatal multivitamin Tabs tablet Take 1 tablet by mouth daily at 12 noon.      Follow-up Information    [facility] [place]. Go to.   Why:  regular scheduled prenatal appointment Contact information: [address] [place] [address] [phone]          Total time spent taking care of this patient: 20 minutes  Signed: Tresea Mall, CNM  12/13/2016, 11:51 PM

## 2016-12-15 ENCOUNTER — Ambulatory Visit: Payer: Self-pay

## 2016-12-15 ENCOUNTER — Ambulatory Visit (INDEPENDENT_AMBULATORY_CARE_PROVIDER_SITE_OTHER): Payer: Medicaid Other | Admitting: Obstetrics & Gynecology

## 2016-12-15 ENCOUNTER — Ambulatory Visit (INDEPENDENT_AMBULATORY_CARE_PROVIDER_SITE_OTHER): Payer: Medicaid Other | Admitting: *Deleted

## 2016-12-15 VITALS — BP 97/52 | HR 113

## 2016-12-15 DIAGNOSIS — O0990 Supervision of high risk pregnancy, unspecified, unspecified trimester: Secondary | ICD-10-CM

## 2016-12-15 DIAGNOSIS — O133 Gestational [pregnancy-induced] hypertension without significant proteinuria, third trimester: Secondary | ICD-10-CM | POA: Diagnosis present

## 2016-12-15 DIAGNOSIS — O139 Gestational [pregnancy-induced] hypertension without significant proteinuria, unspecified trimester: Secondary | ICD-10-CM

## 2016-12-15 DIAGNOSIS — O0993 Supervision of high risk pregnancy, unspecified, third trimester: Secondary | ICD-10-CM

## 2016-12-15 LAB — POCT URINALYSIS DIP (DEVICE)
GLUCOSE, UA: NEGATIVE mg/dL
Hgb urine dipstick: NEGATIVE
KETONES UR: NEGATIVE mg/dL
Nitrite: NEGATIVE
Protein, ur: NEGATIVE mg/dL
SPECIFIC GRAVITY, URINE: 1.02 (ref 1.005–1.030)
Urobilinogen, UA: 0.2 mg/dL (ref 0.0–1.0)
pH: 5.5 (ref 5.0–8.0)

## 2016-12-15 NOTE — Progress Notes (Signed)

## 2016-12-15 NOTE — Progress Notes (Signed)
Pt states she was seen @ [facility] earlier this week due to she was unable to move her legs. Pt states she felt dizzy last night.  She took her BP and it was 142/97 and pulse - 133.

## 2016-12-15 NOTE — Patient Instructions (Signed)
Third Trimester of Pregnancy The third trimester is from week 28 through week 40 (months 7 through 9). The third trimester is a time when the unborn baby (fetus) is growing rapidly. At the end of the ninth month, the fetus is about 20 inches in length and weighs 6-10 pounds. Body changes during your third trimester Your body will continue to go through many changes during pregnancy. The changes vary from woman to woman. During the third trimester:  Your weight will continue to increase. You can expect to gain 25-35 pounds (11-16 kg) by the end of the pregnancy.  You may begin to get stretch marks on your hips, abdomen, and breasts.  You may urinate more often because the fetus is moving lower into your pelvis and pressing on your bladder.  You may develop or continue to have heartburn. This is caused by increased hormones that slow down muscles in the digestive tract.  You may develop or continue to have constipation because increased hormones slow digestion and cause the muscles that push waste through your intestines to relax.  You may develop hemorrhoids. These are swollen veins (varicose veins) in the rectum that can itch or be painful.  You may develop swollen, bulging veins (varicose veins) in your legs.  You may have increased body aches in the pelvis, back, or thighs. This is due to weight gain and increased hormones that are relaxing your joints.  You may have changes in your hair. These can include thickening of your hair, rapid growth, and changes in texture. Some women also have hair loss during or after pregnancy, or hair that feels dry or thin. Your hair will most likely return to normal after your baby is born.  Your breasts will continue to grow and they will continue to become tender. A yellow fluid (colostrum) may leak from your breasts. This is the first milk you are producing for your baby.  Your belly button may stick out.  You may notice more swelling in your hands,  face, or ankles.  You may have increased tingling or numbness in your hands, arms, and legs. The skin on your belly may also feel numb.  You may feel short of breath because of your expanding uterus.  You may have more problems sleeping. This can be caused by the size of your belly, increased need to urinate, and an increase in your body's metabolism.  You may notice the fetus "dropping," or moving lower in your abdomen (lightening).  You may have increased vaginal discharge.  You may notice your joints feel loose and you may have pain around your pelvic bone.  What to expect at prenatal visits You will have prenatal exams every 2 weeks until week 36. Then you will have weekly prenatal exams. During a routine prenatal visit:  You will be weighed to make sure you and the baby are growing normally.  Your blood pressure will be taken.  Your abdomen will be measured to track your baby's growth.  The fetal heartbeat will be listened to.  Any test results from the previous visit will be discussed.  You may have a cervical check near your due date to see if your cervix has softened or thinned (effaced).  You will be tested for Group B streptococcus. This happens between 35 and 37 weeks.  Your health care provider may ask you:  What your birth plan is.  How you are feeling.  If you are feeling the baby move.  If you have had   any abnormal symptoms, such as leaking fluid, bleeding, severe headaches, or abdominal cramping.  If you are using any tobacco products, including cigarettes, chewing tobacco, and electronic cigarettes.  If you have any questions.  Other tests or screenings that may be performed during your third trimester include:  Blood tests that check for low iron levels (anemia).  Fetal testing to check the health, activity level, and growth of the fetus. Testing is done if you have certain medical conditions or if there are problems during the  pregnancy.  Nonstress test (NST). This test checks the health of your baby to make sure there are no signs of problems, such as the baby not getting enough oxygen. During this test, a belt is placed around your belly. The baby is made to move, and its heart rate is monitored during movement.  What is false labor? False labor is a condition in which you feel small, irregular tightenings of the muscles in the womb (contractions) that usually go away with rest, changing position, or drinking water. These are called Braxton Hicks contractions. Contractions may last for hours, days, or even weeks before true labor sets in. If contractions come at regular intervals, become more frequent, increase in intensity, or become painful, you should see your health care provider. What are the signs of labor?  Abdominal cramps.  Regular contractions that start at 10 minutes apart and become stronger and more frequent with time.  Contractions that start on the top of the uterus and spread down to the lower abdomen and back.  Increased pelvic pressure and dull back pain.  A watery or bloody mucus discharge that comes from the vagina.  Leaking of amniotic fluid. This is also known as your "water breaking." It could be a slow trickle or a gush. Let your health care provider know if it has a color or strange odor. If you have any of these signs, call your health care provider right away, even if it is before your due date. Follow these instructions at home: Medicines  Follow your health care provider's instructions regarding medicine use. Specific medicines may be either safe or unsafe to take during pregnancy.  Take a prenatal vitamin that contains at least 600 micrograms (mcg) of folic acid.  If you develop constipation, try taking a stool softener if your health care provider approves. Eating and drinking  Eat a balanced diet that includes fresh fruits and vegetables, whole grains, good sources of protein  such as meat, eggs, or tofu, and low-fat dairy. Your health care provider will help you determine the amount of weight gain that is right for you.  Avoid raw meat and uncooked cheese. These carry germs that can cause birth defects in the baby.  If you have low calcium intake from food, talk to your health care provider about whether you should take a daily calcium supplement.  Eat four or five small meals rather than three large meals a day.  Limit foods that are high in fat and processed sugars, such as fried and sweet foods.  To prevent constipation: ? Drink enough fluid to keep your urine clear or pale yellow. ? Eat foods that are high in fiber, such as fresh fruits and vegetables, whole grains, and beans. Activity  Exercise only as directed by your health care provider. Most women can continue their usual exercise routine during pregnancy. Try to exercise for 30 minutes at least 5 days a week. Stop exercising if you experience uterine contractions.  Avoid heavy   lifting.  Do not exercise in extreme heat or humidity, or at high altitudes.  Wear low-heel, comfortable shoes.  Practice good posture.  You may continue to have sex unless your health care provider tells you otherwise. Relieving pain and discomfort  Take frequent breaks and rest with your legs elevated if you have leg cramps or low back pain.  Take warm sitz baths to soothe any pain or discomfort caused by hemorrhoids. Use hemorrhoid cream if your health care provider approves.  Wear a good support bra to prevent discomfort from breast tenderness.  If you develop varicose veins: ? Wear support pantyhose or compression stockings as told by your healthcare provider. ? Elevate your feet for 15 minutes, 3-4 times a day. Prenatal care  Write down your questions. Take them to your prenatal visits.  Keep all your prenatal visits as told by your health care provider. This is important. Safety  Wear your seat belt at  all times when driving.  Make a list of emergency phone numbers, including numbers for family, friends, the hospital, and police and fire departments. General instructions  Avoid cat litter boxes and soil used by cats. These carry germs that can cause birth defects in the baby. If you have a cat, ask someone to clean the litter box for you.  Do not travel far distances unless it is absolutely necessary and only with the approval of your health care provider.  Do not use hot tubs, steam rooms, or saunas.  Do not drink alcohol.  Do not use any products that contain nicotine or tobacco, such as cigarettes and e-cigarettes. If you need help quitting, ask your health care provider.  Do not use any medicinal herbs or unprescribed drugs. These chemicals affect the formation and growth of the baby.  Do not douche or use tampons or scented sanitary pads.  Do not cross your legs for long periods of time.  To prepare for the arrival of your baby: ? Take prenatal classes to understand, practice, and ask questions about labor and delivery. ? Make a trial run to the hospital. ? Visit the hospital and tour the maternity area. ? Arrange for maternity or paternity leave through employers. ? Arrange for family and friends to take care of pets while you are in the hospital. ? Purchase a rear-facing car seat and make sure you know how to install it in your car. ? Pack your hospital bag. ? Prepare the baby's nursery. Make sure to remove all pillows and stuffed animals from the baby's crib to prevent suffocation.  Visit your dentist if you have not gone during your pregnancy. Use a soft toothbrush to brush your teeth and be gentle when you floss. Contact a health care provider if:  You are unsure if you are in labor or if your water has broken.  You become dizzy.  You have mild pelvic cramps, pelvic pressure, or nagging pain in your abdominal area.  You have lower back pain.  You have persistent  nausea, vomiting, or diarrhea.  You have an unusual or bad smelling vaginal discharge.  You have pain when you urinate. Get help right away if:  Your water breaks before 37 weeks.  You have regular contractions less than 5 minutes apart before 37 weeks.  You have a fever.  You are leaking fluid from your vagina.  You have spotting or bleeding from your vagina.  You have severe abdominal pain or cramping.  You have rapid weight loss or weight gain.    You have shortness of breath with chest pain.  You notice sudden or extreme swelling of your face, hands, ankles, feet, or legs.  Your baby makes fewer than 10 movements in 2 hours.  You have severe headaches that do not go away when you take medicine.  You have vision changes. Summary  The third trimester is from week 28 through week 40, months 7 through 9. The third trimester is a time when the unborn baby (fetus) is growing rapidly.  During the third trimester, your discomfort may increase as you and your baby continue to gain weight. You may have abdominal, leg, and back pain, sleeping problems, and an increased need to urinate.  During the third trimester your breasts will keep growing and they will continue to become tender. A yellow fluid (colostrum) may leak from your breasts. This is the first milk you are producing for your baby.  False labor is a condition in which you feel small, irregular tightenings of the muscles in the womb (contractions) that eventually go away. These are called Braxton Hicks contractions. Contractions may last for hours, days, or even weeks before true labor sets in.  Signs of labor can include: abdominal cramps; regular contractions that start at 10 minutes apart and become stronger and more frequent with time; watery or bloody mucus discharge that comes from the vagina; increased pelvic pressure and dull back pain; and leaking of amniotic fluid. This information is not intended to replace advice  given to you by your health care provider. Make sure you discuss any questions you have with your health care provider. Document Released: 02/22/2001 Document Revised: 08/06/2015 Document Reviewed: 05/01/2012 Elsevier Interactive Patient Education  2017 [employer]  

## 2016-12-15 NOTE — Progress Notes (Signed)
   PRENATAL VISIT NOTE  Subjective:  Breanna Carrillo is a 21 y.o. G2P1001 at [redacted]w[redacted]d being seen today for ongoing prenatal care.  She is currently monitored for the following issues for this high-risk pregnancy and has Hyperemesis gravidarum; Supervision of high risk pregnancy, antepartum; Obesity affecting pregnancy, antepartum; Hypercholesterolemia; Obesity (BMI 35.0-39.9 without comorbidity); Hx of preeclampsia, prior pregnancy, currently pregnant; and Gestational hypertension, antepartum on her problem list.  Patient reports occasional contractions and pelvic pressure and noted increased BP and pulse recently.  Contractions: Irregular. Vag. Bleeding: None.  Movement: (!) Decreased. Denies leaking of fluid.   The following portions of the patient's history were reviewed and updated as appropriate: allergies, current medications, past family history, past medical history, past social history, past surgical history and problem list. Problem list updated.  Objective:   Vitals:   12/15/16 1005  BP: (!) 97/52  Pulse: (!) 113    Fetal Status: Fetal Heart Rate (bpm): NST   Movement: (!) Decreased     General:  Alert, oriented and cooperative. Patient is in no acute distress.  Skin: Skin is warm and dry. No rash noted.   Cardiovascular: Normal heart rate noted  Respiratory: Normal respiratory effort, no problems with respiration noted  Abdomen: Soft, gravid, appropriate for gestational age.  Pain/Pressure: Present     Pelvic: Cervical exam performed        Extremities: Normal range of motion.  Edema: None  Mental Status:  Normal mood and affect. Normal behavior. Normal judgment and thought content.   Assessment and Plan:  Pregnancy: G2P1001 at [redacted]w[redacted]d  1. Supervision of high risk pregnancy, antepartum Labile BP  2. Pregnancy-induced hypertension in third trimester F/u growth  3. Gestational hypertension, antepartum BP reviewed  Preterm labor symptoms and general obstetric precautions  including but not limited to vaginal bleeding, contractions, leaking of fluid and fetal movement were reviewed in detail with the patient. Please refer to After Visit Summary for other counseling recommendations.  Return in about 7 days (around 12/22/2016) for NST/BPP and HOB.   Scheryl Darter, MD

## 2016-12-22 ENCOUNTER — Ambulatory Visit (INDEPENDENT_AMBULATORY_CARE_PROVIDER_SITE_OTHER): Payer: Medicaid Other | Admitting: Obstetrics & Gynecology

## 2016-12-22 ENCOUNTER — Ambulatory Visit: Payer: Self-pay

## 2016-12-22 ENCOUNTER — Ambulatory Visit (INDEPENDENT_AMBULATORY_CARE_PROVIDER_SITE_OTHER): Payer: Medicaid Other | Admitting: *Deleted

## 2016-12-22 VITALS — BP 106/82 | HR 122 | Wt 238.1 lb

## 2016-12-22 DIAGNOSIS — O133 Gestational [pregnancy-induced] hypertension without significant proteinuria, third trimester: Secondary | ICD-10-CM | POA: Diagnosis present

## 2016-12-22 DIAGNOSIS — O0993 Supervision of high risk pregnancy, unspecified, third trimester: Secondary | ICD-10-CM

## 2016-12-22 DIAGNOSIS — O0990 Supervision of high risk pregnancy, unspecified, unspecified trimester: Secondary | ICD-10-CM

## 2016-12-22 LAB — POCT URINALYSIS DIP (DEVICE)
BILIRUBIN URINE: NEGATIVE
GLUCOSE, UA: 100 mg/dL — AB
HGB URINE DIPSTICK: NEGATIVE
KETONES UR: NEGATIVE mg/dL
NITRITE: NEGATIVE
Protein, ur: NEGATIVE mg/dL
Specific Gravity, Urine: 1.02 (ref 1.005–1.030)
UROBILINOGEN UA: 0.2 mg/dL (ref 0.0–1.0)
pH: 7 (ref 5.0–8.0)

## 2016-12-22 NOTE — Patient Instructions (Signed)
Third Trimester of Pregnancy The third trimester is from week 28 through week 40 (months 7 through 9). The third trimester is a time when the unborn baby (fetus) is growing rapidly. At the end of the ninth month, the fetus is about 20 inches in length and weighs 6-10 pounds. Body changes during your third trimester Your body will continue to go through many changes during pregnancy. The changes vary from woman to woman. During the third trimester:  Your weight will continue to increase. You can expect to gain 25-35 pounds (11-16 kg) by the end of the pregnancy.  You may begin to get stretch marks on your hips, abdomen, and breasts.  You may urinate more often because the fetus is moving lower into your pelvis and pressing on your bladder.  You may develop or continue to have heartburn. This is caused by increased hormones that slow down muscles in the digestive tract.  You may develop or continue to have constipation because increased hormones slow digestion and cause the muscles that push waste through your intestines to relax.  You may develop hemorrhoids. These are swollen veins (varicose veins) in the rectum that can itch or be painful.  You may develop swollen, bulging veins (varicose veins) in your legs.  You may have increased body aches in the pelvis, back, or thighs. This is due to weight gain and increased hormones that are relaxing your joints.  You may have changes in your hair. These can include thickening of your hair, rapid growth, and changes in texture. Some women also have hair loss during or after pregnancy, or hair that feels dry or thin. Your hair will most likely return to normal after your baby is born.  Your breasts will continue to grow and they will continue to become tender. A yellow fluid (colostrum) may leak from your breasts. This is the first milk you are producing for your baby.  Your belly button may stick out.  You may notice more swelling in your hands,  face, or ankles.  You may have increased tingling or numbness in your hands, arms, and legs. The skin on your belly may also feel numb.  You may feel short of breath because of your expanding uterus.  You may have more problems sleeping. This can be caused by the size of your belly, increased need to urinate, and an increase in your body's metabolism.  You may notice the fetus "dropping," or moving lower in your abdomen (lightening).  You may have increased vaginal discharge.  You may notice your joints feel loose and you may have pain around your pelvic bone.  What to expect at prenatal visits You will have prenatal exams every 2 weeks until week 36. Then you will have weekly prenatal exams. During a routine prenatal visit:  You will be weighed to make sure you and the baby are growing normally.  Your blood pressure will be taken.  Your abdomen will be measured to track your baby's growth.  The fetal heartbeat will be listened to.  Any test results from the previous visit will be discussed.  You may have a cervical check near your due date to see if your cervix has softened or thinned (effaced).  You will be tested for Group B streptococcus. This happens between 35 and 37 weeks.  Your health care provider may ask you:  What your birth plan is.  How you are feeling.  If you are feeling the baby move.  If you have had   any abnormal symptoms, such as leaking fluid, bleeding, severe headaches, or abdominal cramping.  If you are using any tobacco products, including cigarettes, chewing tobacco, and electronic cigarettes.  If you have any questions.  Other tests or screenings that may be performed during your third trimester include:  Blood tests that check for low iron levels (anemia).  Fetal testing to check the health, activity level, and growth of the fetus. Testing is done if you have certain medical conditions or if there are problems during the  pregnancy.  Nonstress test (NST). This test checks the health of your baby to make sure there are no signs of problems, such as the baby not getting enough oxygen. During this test, a belt is placed around your belly. The baby is made to move, and its heart rate is monitored during movement.  What is false labor? False labor is a condition in which you feel small, irregular tightenings of the muscles in the womb (contractions) that usually go away with rest, changing position, or drinking water. These are called Braxton Hicks contractions. Contractions may last for hours, days, or even weeks before true labor sets in. If contractions come at regular intervals, become more frequent, increase in intensity, or become painful, you should see your health care provider. What are the signs of labor?  Abdominal cramps.  Regular contractions that start at 10 minutes apart and become stronger and more frequent with time.  Contractions that start on the top of the uterus and spread down to the lower abdomen and back.  Increased pelvic pressure and dull back pain.  A watery or bloody mucus discharge that comes from the vagina.  Leaking of amniotic fluid. This is also known as your "water breaking." It could be a slow trickle or a gush. Let your health care provider know if it has a color or strange odor. If you have any of these signs, call your health care provider right away, even if it is before your due date. Follow these instructions at home: Medicines  Follow your health care provider's instructions regarding medicine use. Specific medicines may be either safe or unsafe to take during pregnancy.  Take a prenatal vitamin that contains at least 600 micrograms (mcg) of folic acid.  If you develop constipation, try taking a stool softener if your health care provider approves. Eating and drinking  Eat a balanced diet that includes fresh fruits and vegetables, whole grains, good sources of protein  such as meat, eggs, or tofu, and low-fat dairy. Your health care provider will help you determine the amount of weight gain that is right for you.  Avoid raw meat and uncooked cheese. These carry germs that can cause birth defects in the baby.  If you have low calcium intake from food, talk to your health care provider about whether you should take a daily calcium supplement.  Eat four or five small meals rather than three large meals a day.  Limit foods that are high in fat and processed sugars, such as fried and sweet foods.  To prevent constipation: ? Drink enough fluid to keep your urine clear or pale yellow. ? Eat foods that are high in fiber, such as fresh fruits and vegetables, whole grains, and beans. Activity  Exercise only as directed by your health care provider. Most women can continue their usual exercise routine during pregnancy. Try to exercise for 30 minutes at least 5 days a week. Stop exercising if you experience uterine contractions.  Avoid heavy   lifting.  Do not exercise in extreme heat or humidity, or at high altitudes.  Wear low-heel, comfortable shoes.  Practice good posture.  You may continue to have sex unless your health care provider tells you otherwise. Relieving pain and discomfort  Take frequent breaks and rest with your legs elevated if you have leg cramps or low back pain.  Take warm sitz baths to soothe any pain or discomfort caused by hemorrhoids. Use hemorrhoid cream if your health care provider approves.  Wear a good support bra to prevent discomfort from breast tenderness.  If you develop varicose veins: ? Wear support pantyhose or compression stockings as told by your healthcare provider. ? Elevate your feet for 15 minutes, 3-4 times a day. Prenatal care  Write down your questions. Take them to your prenatal visits.  Keep all your prenatal visits as told by your health care provider. This is important. Safety  Wear your seat belt at  all times when driving.  Make a list of emergency phone numbers, including numbers for family, friends, the hospital, and police and fire departments. General instructions  Avoid cat litter boxes and soil used by cats. These carry germs that can cause birth defects in the baby. If you have a cat, ask someone to clean the litter box for you.  Do not travel far distances unless it is absolutely necessary and only with the approval of your health care provider.  Do not use hot tubs, steam rooms, or saunas.  Do not drink alcohol.  Do not use any products that contain nicotine or tobacco, such as cigarettes and e-cigarettes. If you need help quitting, ask your health care provider.  Do not use any medicinal herbs or unprescribed drugs. These chemicals affect the formation and growth of the baby.  Do not douche or use tampons or scented sanitary pads.  Do not cross your legs for long periods of time.  To prepare for the arrival of your baby: ? Take prenatal classes to understand, practice, and ask questions about labor and delivery. ? Make a trial run to the hospital. ? Visit the hospital and tour the maternity area. ? Arrange for maternity or paternity leave through employers. ? Arrange for family and friends to take care of pets while you are in the hospital. ? Purchase a rear-facing car seat and make sure you know how to install it in your car. ? Pack your hospital bag. ? Prepare the baby's nursery. Make sure to remove all pillows and stuffed animals from the baby's crib to prevent suffocation.  Visit your dentist if you have not gone during your pregnancy. Use a soft toothbrush to brush your teeth and be gentle when you floss. Contact a health care provider if:  You are unsure if you are in labor or if your water has broken.  You become dizzy.  You have mild pelvic cramps, pelvic pressure, or nagging pain in your abdominal area.  You have lower back pain.  You have persistent  nausea, vomiting, or diarrhea.  You have an unusual or bad smelling vaginal discharge.  You have pain when you urinate. Get help right away if:  Your water breaks before 37 weeks.  You have regular contractions less than 5 minutes apart before 37 weeks.  You have a fever.  You are leaking fluid from your vagina.  You have spotting or bleeding from your vagina.  You have severe abdominal pain or cramping.  You have rapid weight loss or weight gain.    You have shortness of breath with chest pain.  You notice sudden or extreme swelling of your face, hands, ankles, feet, or legs.  Your baby makes fewer than 10 movements in 2 hours.  You have severe headaches that do not go away when you take medicine.  You have vision changes. Summary  The third trimester is from week 28 through week 40, months 7 through 9. The third trimester is a time when the unborn baby (fetus) is growing rapidly.  During the third trimester, your discomfort may increase as you and your baby continue to gain weight. You may have abdominal, leg, and back pain, sleeping problems, and an increased need to urinate.  During the third trimester your breasts will keep growing and they will continue to become tender. A yellow fluid (colostrum) may leak from your breasts. This is the first milk you are producing for your baby.  False labor is a condition in which you feel small, irregular tightenings of the muscles in the womb (contractions) that eventually go away. These are called Braxton Hicks contractions. Contractions may last for hours, days, or even weeks before true labor sets in.  Signs of labor can include: abdominal cramps; regular contractions that start at 10 minutes apart and become stronger and more frequent with time; watery or bloody mucus discharge that comes from the vagina; increased pelvic pressure and dull back pain; and leaking of amniotic fluid. This information is not intended to replace advice  given to you by your health care provider. Make sure you discuss any questions you have with your health care provider. Document Released: 02/22/2001 Document Revised: 08/06/2015 Document Reviewed: 05/01/2012 Elsevier Interactive Patient Education  2017 [employer]  

## 2016-12-22 NOTE — Progress Notes (Signed)

## 2016-12-22 NOTE — Progress Notes (Signed)
Pt reports severe pelvic pain and walking is extremely difficult. Pt also states she has occasional RUQ pain and wakes up with a H/A on most days.  The pain is relieved by Tylenol.  She denies visual disturbances.

## 2016-12-22 NOTE — Progress Notes (Signed)
   PRENATAL VISIT NOTE  Subjective:  Breanna Carrillo is a 21 y.o. G2P1001 at [redacted]w[redacted]d being seen today for ongoing prenatal care.  She is currently monitored for the following issues for this high-risk pregnancy and has Hyperemesis gravidarum; Supervision of high risk pregnancy, antepartum; Obesity affecting pregnancy, antepartum; Hypercholesterolemia; Obesity (BMI 35.0-39.9 without comorbidity); Hx of preeclampsia, prior pregnancy, currently pregnant; and Gestational hypertension, antepartum on her problem list.  Patient reports headache.  Contractions: Irregular. Vag. Bleeding: None.  Movement: Present. Denies leaking of fluid.   The following portions of the patient's history were reviewed and updated as appropriate: allergies, current medications, past family history, past medical history, past social history, past surgical history and problem list. Problem list updated.  Objective:   Vitals:   12/22/16 0753  BP: (!) 144/121  Pulse: (!) 122  Weight: 238 lb 1.6 oz (108 kg)    Fetal Status: Fetal Heart Rate (bpm): NST   Movement: Present     General:  Alert, oriented and cooperative. Patient is in no acute distress.  Skin: Skin is warm and dry. No rash noted.   Cardiovascular: Normal heart rate noted  Respiratory: Normal respiratory effort, no problems with respiration noted  Abdomen: Soft, gravid, appropriate for gestational age.  Pain/Pressure: Present     Pelvic: Cervical exam deferred        Extremities: Normal range of motion.  Edema: None  Mental Status:  Normal mood and affect. Normal behavior. Normal judgment and thought content.   Assessment and Plan:  Pregnancy: G2P1001 at [redacted]w[redacted]d  1. Supervision of high risk pregnancy, antepartum Repeat labs  - Protein / creatinine ratio, urine - Comprehensive metabolic panel - CBC  2. Pregnancy-induced hypertension in third trimester   Preterm labor symptoms and general obstetric precautions including but not limited to vaginal  bleeding, contractions, leaking of fluid and fetal movement were reviewed in detail with the patient. Please refer to After Visit Summary for other counseling recommendations.  Return in about 1 week (around 12/29/2016) for BPP.   Scheryl Darter, MD

## 2016-12-23 LAB — COMPREHENSIVE METABOLIC PANEL
A/G RATIO: 1.2 (ref 1.2–2.2)
ALBUMIN: 3.3 g/dL — AB (ref 3.5–5.5)
ALT: 7 IU/L (ref 0–32)
AST: 11 IU/L (ref 0–40)
Alkaline Phosphatase: 155 IU/L — ABNORMAL HIGH (ref 39–117)
BUN / CREAT RATIO: 14 (ref 9–23)
BUN: 6 mg/dL (ref 6–20)
CHLORIDE: 102 mmol/L (ref 96–106)
CO2: 18 mmol/L — ABNORMAL LOW (ref 20–29)
Calcium: 8.6 mg/dL — ABNORMAL LOW (ref 8.7–10.2)
Creatinine, Ser: 0.43 mg/dL — ABNORMAL LOW (ref 0.57–1.00)
GFR calc non Af Amer: 147 mL/min/{1.73_m2} (ref >59)
GFR, EST AFRICAN AMERICAN: 169 mL/min/{1.73_m2} (ref >59)
GLOBULIN, TOTAL: 2.7 g/dL (ref 1.5–4.5)
Glucose: 90 mg/dL (ref 65–99)
POTASSIUM: 4.3 mmol/L (ref 3.5–5.2)
SODIUM: 135 mmol/L (ref 134–144)
TOTAL PROTEIN: 6 g/dL (ref 6.0–8.5)

## 2016-12-23 LAB — CBC
HEMATOCRIT: 31.7 % — AB (ref 34.0–46.6)
Hemoglobin: 10 g/dL — ABNORMAL LOW (ref 11.1–15.9)
MCH: 25.5 pg — ABNORMAL LOW (ref 26.6–33.0)
MCHC: 31.5 g/dL (ref 31.5–35.7)
MCV: 81 fL (ref 79–97)
PLATELETS: 247 10*3/uL (ref 150–379)
RBC: 3.92 x10E6/uL (ref 3.77–5.28)
RDW: 14.4 % (ref 12.3–15.4)
WBC: 12.3 10*3/uL — ABNORMAL HIGH (ref 3.4–10.8)

## 2016-12-23 LAB — PROTEIN / CREATININE RATIO, URINE
CREATININE, UR: 102.2 mg/dL
Protein, Ur: 21.9 mg/dL
Protein/Creat Ratio: 214 mg/g creat — ABNORMAL HIGH (ref 0–200)

## 2016-12-26 ENCOUNTER — Telehealth: Payer: Self-pay | Admitting: General Practice

## 2016-12-26 NOTE — Telephone Encounter (Signed)
Patient called and left message stating she is [redacted] weeks pregnant and wants to know if she can take monistat as she feels her pH balance is off. Called patient, no answer- left message stating we have received your phone message and that medication is fine for you to take. If you have other questions you may call us back or send [place] a mychart message.

## 2016-12-29 ENCOUNTER — Ambulatory Visit (INDEPENDENT_AMBULATORY_CARE_PROVIDER_SITE_OTHER): Payer: Medicaid Other | Admitting: *Deleted

## 2016-12-29 ENCOUNTER — Ambulatory Visit: Payer: Self-pay

## 2016-12-29 ENCOUNTER — Other Ambulatory Visit (HOSPITAL_COMMUNITY)
Admission: RE | Admit: 2016-12-29 | Discharge: 2016-12-29 | Disposition: A | Discharge status: Home / Self Care | Payer: Medicaid Other | Source: Ambulatory Visit | Attending: Obstetrics & Gynecology | Admitting: Obstetrics & Gynecology

## 2016-12-29 ENCOUNTER — Ambulatory Visit (INDEPENDENT_AMBULATORY_CARE_PROVIDER_SITE_OTHER): Payer: Medicaid Other | Admitting: Obstetrics & Gynecology

## 2016-12-29 VITALS — BP 114/65 | HR 101 | Wt 237.2 lb

## 2016-12-29 DIAGNOSIS — O139 Gestational [pregnancy-induced] hypertension without significant proteinuria, unspecified trimester: Secondary | ICD-10-CM

## 2016-12-29 DIAGNOSIS — Z113 Encounter for screening for infections with a predominantly sexual mode of transmission: Secondary | ICD-10-CM | POA: Diagnosis not present

## 2016-12-29 DIAGNOSIS — O0990 Supervision of high risk pregnancy, unspecified, unspecified trimester: Secondary | ICD-10-CM | POA: Diagnosis present

## 2016-12-29 DIAGNOSIS — O0993 Supervision of high risk pregnancy, unspecified, third trimester: Secondary | ICD-10-CM | POA: Diagnosis present

## 2016-12-29 DIAGNOSIS — O133 Gestational [pregnancy-induced] hypertension without significant proteinuria, third trimester: Secondary | ICD-10-CM | POA: Diagnosis present

## 2016-12-29 LAB — POCT URINALYSIS DIP (DEVICE)
GLUCOSE, UA: NEGATIVE mg/dL
KETONES UR: 15 mg/dL — AB
Nitrite: NEGATIVE
Protein, ur: 30 mg/dL — AB
Urobilinogen, UA: 2 mg/dL — ABNORMAL HIGH (ref 0.0–1.0)
pH: 6 (ref 5.0–8.0)

## 2016-12-29 LAB — OB RESULTS CONSOLE GBS: GBS: NEGATIVE

## 2016-12-29 LAB — OB RESULTS CONSOLE GC/CHLAMYDIA: Gonorrhea: NEGATIVE

## 2016-12-29 NOTE — Progress Notes (Signed)
   PRENATAL VISIT NOTE  Subjective:  Breanna Carrillo is a 21 y.o. G2P1001 at 3639w1d being seen today for ongoing prenatal care.  She is currently monitored for the following issues for this high-risk pregnancy and has Hyperemesis gravidarum; Supervision of high risk pregnancy, antepartum; Obesity affecting pregnancy, antepartum; Hypercholesterolemia; Obesity (BMI 35.0-39.9 without comorbidity); Hx of preeclampsia, prior pregnancy, currently pregnant; and Gestational hypertension, antepartum on her problem list.  Patient reports no complaints.  Contractions: Irregular. Vag. Bleeding: None, Scant.  Movement: (!) Decreased. Denies leaking of fluid.   The following portions of the patient's history were reviewed and updated as appropriate: allergies, current medications, past family history, past medical history, past social history, past surgical history and problem list. Problem list updated.  Objective:   Vitals:   12/29/16 0805  BP: 114/65  Pulse: (!) 101  Weight: 237 lb 3.2 oz (107.6 kg)    Fetal Status: Fetal Heart Rate (bpm): NST Fundal Height: 36 cm Movement: (!) Decreased     General:  Alert, oriented and cooperative. Patient is in no acute distress.  Skin: Skin is warm and dry. No rash noted.   Cardiovascular: Normal heart rate noted  Respiratory: Normal respiratory effort, no problems with respiration noted  Abdomen: Soft, gravid, appropriate for gestational age.  Pain/Pressure: Present     Pelvic: Cervical exam deferred        Extremities: Normal range of motion.  Edema: Trace  Mental Status:  Normal mood and affect. Normal behavior. Normal judgment and thought content.   Assessment and Plan:  Pregnancy: G2P1001 at 4739w1d  1. Supervision of high risk pregnancy, antepartum  - GC[facility]Chlamydia [facility] amp [facility] - Strep Gp B NAA  2. Gestational hypertension, antepartum Normal BP today but labile  Preterm labor symptoms and general obstetric precautions  including but not limited to vaginal bleeding, contractions, leaking of fluid and fetal movement were reviewed in detail with the patient. Please refer to After Visit Summary for other counseling recommendations.    Scheryl DarterJames Arnold, MD

## 2016-12-29 NOTE — Progress Notes (Signed)

## 2016-12-29 NOTE — Patient Instructions (Signed)
Labor Induction Labor induction is when steps are taken to cause a pregnant woman to begin the labor process. Most women go into labor on their own between 37 weeks and 42 weeks of the pregnancy. When this does not happen or when there is a medical need, methods may be used to induce labor. Labor induction causes a pregnant woman's uterus to contract. It also causes the cervix to soften (ripen), open (dilate), and thin out (efface). Usually, labor is not induced before 39 weeks of the pregnancy unless there is a problem with the baby or mother. Before inducing labor, your health care provider will consider a number of factors, including the following:  The medical condition of you and the baby.  How many weeks along you are.  The status of the baby's lung maturity.  The condition of the cervix.  The position of the baby. What are the reasons for labor induction? Labor may be induced for the following reasons:  The health of the baby or mother is at risk.  The pregnancy is overdue by 1 week or more.  The water breaks but labor does not start on its own.  The mother has a health condition or serious illness, such as high blood pressure, infection, placental abruption, or diabetes.  The amniotic fluid amounts are low around the baby.  The baby is distressed. Convenience or wanting the baby to be born on a certain date is not a reason for inducing labor. What methods are used for labor induction? Several methods of labor induction may be used, such as:  Prostaglandin medicine. This medicine causes the cervix to dilate and ripen. The medicine will also start contractions. It can be taken by mouth or by inserting a suppository into the vagina.  Inserting a thin tube (catheter) with a balloon on the end into the vagina to dilate the cervix. Once inserted, the balloon is expanded with water, which causes the cervix to open.  Stripping the membranes. Your health care provider separates  amniotic sac tissue from the cervix, causing the cervix to be stretched and causing the release of a hormone called progesterone. This may cause the uterus to contract. It is often done during an office visit. You will be sent home to wait for the contractions to begin. You will then come in for an induction.  Breaking the water. Your health care provider makes a hole in the amniotic sac using a small instrument. Once the amniotic sac breaks, contractions should begin. This may still take hours to see an effect.  Medicine to trigger or strengthen contractions. This medicine is given through an IV access tube inserted into a vein in your arm. All of the methods of induction, besides stripping the membranes, will be done in the hospital. Induction is done in the hospital so that you and the baby can be carefully monitored. How long does it take for labor to be induced? Some inductions can take up to 2-3 days. Depending on the cervix, it usually takes less time. It takes longer when you are induced early in the pregnancy or if this is your first pregnancy. If a mother is still pregnant and the induction has been going on for 2-3 days, either the mother will be sent home or a cesarean delivery will be needed. What are the risks associated with labor induction? Some of the risks of induction include:  Changes in fetal heart rate, such as too high, too low, or erratic.  Fetal distress.    Chance of infection for the mother and baby.  Increased chance of having a cesarean delivery.  Breaking off (abruption) of the placenta from the uterus (rare).  Uterine rupture (very rare). When induction is needed for medical reasons, the benefits of induction may outweigh the risks. What are some reasons for not inducing labor? Labor induction should not be done if:  It is shown that your baby does not tolerate labor.  You have had previous surgeries on your uterus, such as a myomectomy or the removal of  fibroids.  Your placenta lies very low in the uterus and blocks the opening of the cervix (placenta previa).  Your baby is not in a head-down position.  The umbilical cord drops down into the birth canal in front of the baby. This could cut off the baby's blood and oxygen supply.  You have had a previous cesarean delivery.  There are unusual circumstances, such as the baby being extremely premature. This information is not intended to replace advice given to you by your health care provider. Make sure you discuss any questions you have with your health care provider. Document Released: 07/20/2006 Document Revised: 08/06/2015 Document Reviewed: 09/27/2012 Elsevier Interactive Patient Education  2017 [employer]  

## 2016-12-29 NOTE — Progress Notes (Signed)
US for growth and BPP scheduled on 10/25.

## 2016-12-30 LAB — GC/CHLAMYDIA PROBE AMP (~~LOC~~) NOT AT ARMC
CHLAMYDIA, DNA PROBE: NEGATIVE
Neisseria Gonorrhea: NEGATIVE

## 2016-12-31 LAB — STREP GP B NAA: Strep Gp B NAA: NEGATIVE

## 2017-01-04 ENCOUNTER — Telehealth (HOSPITAL_COMMUNITY): Payer: Self-pay | Admitting: *Deleted

## 2017-01-04 ENCOUNTER — Ambulatory Visit (HOSPITAL_COMMUNITY): Payer: Medicaid Other

## 2017-01-04 NOTE — Telephone Encounter (Signed)
Preadmission screen  

## 2017-01-05 ENCOUNTER — Ambulatory Visit (INDEPENDENT_AMBULATORY_CARE_PROVIDER_SITE_OTHER): Payer: Medicaid Other | Admitting: *Deleted

## 2017-01-05 ENCOUNTER — Ambulatory Visit (HOSPITAL_COMMUNITY)
Admission: RE | Admit: 2017-01-05 | Discharge: 2017-01-05 | Disposition: A | Discharge status: Home / Self Care | Payer: Medicaid Other | Source: Ambulatory Visit | Attending: Obstetrics & Gynecology | Admitting: Obstetrics & Gynecology

## 2017-01-05 ENCOUNTER — Other Ambulatory Visit: Payer: Self-pay | Admitting: Obstetrics & Gynecology

## 2017-01-05 ENCOUNTER — Ambulatory Visit (INDEPENDENT_AMBULATORY_CARE_PROVIDER_SITE_OTHER): Payer: Medicaid Other | Admitting: Obstetrics & Gynecology

## 2017-01-05 ENCOUNTER — Other Ambulatory Visit (HOSPITAL_COMMUNITY): Payer: Self-pay | Admitting: Obstetrics and Gynecology

## 2017-01-05 ENCOUNTER — Encounter (HOSPITAL_COMMUNITY): Payer: Self-pay

## 2017-01-05 ENCOUNTER — Encounter: Payer: Self-pay | Admitting: Obstetrics & Gynecology

## 2017-01-05 VITALS — BP 104/71 | HR 124 | Wt 240.7 lb

## 2017-01-05 DIAGNOSIS — Z3A36 36 weeks gestation of pregnancy: Secondary | ICD-10-CM

## 2017-01-05 DIAGNOSIS — O139 Gestational [pregnancy-induced] hypertension without significant proteinuria, unspecified trimester: Secondary | ICD-10-CM

## 2017-01-05 DIAGNOSIS — O133 Gestational [pregnancy-induced] hypertension without significant proteinuria, third trimester: Secondary | ICD-10-CM

## 2017-01-05 DIAGNOSIS — O09299 Supervision of pregnancy with other poor reproductive or obstetric history, unspecified trimester: Secondary | ICD-10-CM

## 2017-01-05 DIAGNOSIS — O09293 Supervision of pregnancy with other poor reproductive or obstetric history, third trimester: Secondary | ICD-10-CM | POA: Diagnosis not present

## 2017-01-05 DIAGNOSIS — O0990 Supervision of high risk pregnancy, unspecified, unspecified trimester: Secondary | ICD-10-CM

## 2017-01-05 DIAGNOSIS — Z3689 Encounter for other specified antenatal screening: Secondary | ICD-10-CM | POA: Insufficient documentation

## 2017-01-05 NOTE — Progress Notes (Signed)
US for growth and BPP today.  IOL scheduled on 10/31

## 2017-01-05 NOTE — Patient Instructions (Signed)
Labor Induction Labor induction is when steps are taken to cause a pregnant woman to begin the labor process. Most women go into labor on their own between 37 weeks and 42 weeks of the pregnancy. When this does not happen or when there is a medical need, methods may be used to induce labor. Labor induction causes a pregnant woman's uterus to contract. It also causes the cervix to soften (ripen), open (dilate), and thin out (efface). Usually, labor is not induced before 39 weeks of the pregnancy unless there is a problem with the baby or mother. Before inducing labor, your health care provider will consider a number of factors, including the following:  The medical condition of you and the baby.  How many weeks along you are.  The status of the baby's lung maturity.  The condition of the cervix.  The position of the baby. What are the reasons for labor induction? Labor may be induced for the following reasons:  The health of the baby or mother is at risk.  The pregnancy is overdue by 1 week or more.  The water breaks but labor does not start on its own.  The mother has a health condition or serious illness, such as high blood pressure, infection, placental abruption, or diabetes.  The amniotic fluid amounts are low around the baby.  The baby is distressed. Convenience or wanting the baby to be born on a certain date is not a reason for inducing labor. What methods are used for labor induction? Several methods of labor induction may be used, such as:  Prostaglandin medicine. This medicine causes the cervix to dilate and ripen. The medicine will also start contractions. It can be taken by mouth or by inserting a suppository into the vagina.  Inserting a thin tube (catheter) with a balloon on the end into the vagina to dilate the cervix. Once inserted, the balloon is expanded with water, which causes the cervix to open.  Stripping the membranes. Your health care provider separates  amniotic sac tissue from the cervix, causing the cervix to be stretched and causing the release of a hormone called progesterone. This may cause the uterus to contract. It is often done during an office visit. You will be sent home to wait for the contractions to begin. You will then come in for an induction.  Breaking the water. Your health care provider makes a hole in the amniotic sac using a small instrument. Once the amniotic sac breaks, contractions should begin. This may still take hours to see an effect.  Medicine to trigger or strengthen contractions. This medicine is given through an IV access tube inserted into a vein in your arm. All of the methods of induction, besides stripping the membranes, will be done in the hospital. Induction is done in the hospital so that you and the baby can be carefully monitored. How long does it take for labor to be induced? Some inductions can take up to 2-3 days. Depending on the cervix, it usually takes less time. It takes longer when you are induced early in the pregnancy or if this is your first pregnancy. If a mother is still pregnant and the induction has been going on for 2-3 days, either the mother will be sent home or a cesarean delivery will be needed. What are the risks associated with labor induction? Some of the risks of induction include:  Changes in fetal heart rate, such as too high, too low, or erratic.  Fetal distress.    Chance of infection for the mother and baby.  Increased chance of having a cesarean delivery.  Breaking off (abruption) of the placenta from the uterus (rare).  Uterine rupture (very rare). When induction is needed for medical reasons, the benefits of induction may outweigh the risks. What are some reasons for not inducing labor? Labor induction should not be done if:  It is shown that your baby does not tolerate labor.  You have had previous surgeries on your uterus, such as a myomectomy or the removal of  fibroids.  Your placenta lies very low in the uterus and blocks the opening of the cervix (placenta previa).  Your baby is not in a head-down position.  The umbilical cord drops down into the birth canal in front of the baby. This could cut off the baby's blood and oxygen supply.  You have had a previous cesarean delivery.  There are unusual circumstances, such as the baby being extremely premature. This information is not intended to replace advice given to you by your health care provider. Make sure you discuss any questions you have with your health care provider. Document Released: 07/20/2006 Document Revised: 08/06/2015 Document Reviewed: 09/27/2012 Elsevier Interactive Patient Education  2017 [employer]  

## 2017-01-05 NOTE — Progress Notes (Signed)
   PRENATAL VISIT NOTE  Subjective:  Breanna Carrillo is a 21 y.o. G2P1001 at 5177w1d being seen today for ongoing prenatal care.  She is currently monitored for the following issues for this high-risk pregnancy and has Hyperemesis gravidarum; Supervision of high risk pregnancy, antepartum; Obesity affecting pregnancy, antepartum; Hypercholesterolemia; Obesity (BMI 35.0-39.9 without comorbidity); Hx of preeclampsia, prior pregnancy, currently pregnant; and Gestational hypertension, antepartum on her problem list.  Patient reports occasional contractions.  Contractions: Irregular. Vag. Bleeding: None.  Movement: Present. Denies leaking of fluid.   The following portions of the patient's history were reviewed and updated as appropriate: allergies, current medications, past family history, past medical history, past social history, past surgical history and problem list. Problem list updated.  Objective:   Vitals:   01/05/17 0748  BP: 104/71  Pulse: (!) 124  Weight: 240 lb 11.2 oz (109.2 kg)    Fetal Status: Fetal Heart Rate (bpm): NST   Movement: Present     General:  Alert, oriented and cooperative. Patient is in no acute distress.  Skin: Skin is warm and dry. No rash noted.   Cardiovascular: Normal heart rate noted  Respiratory: Normal respiratory effort, no problems with respiration noted  Abdomen: Soft, gravid, appropriate for gestational age.  Pain/Pressure: Present     Pelvic: Cervical exam deferred        Extremities: Normal range of motion.     Mental Status:  Normal mood and affect. Normal behavior. Normal judgment and thought content.   Assessment and Plan:  Pregnancy: G2P1001 at 2477w1d  1. Supervision of high risk pregnancy, antepartum IOL 37 week  2. Gestational hypertension, antepartum Nl BP  3. Hx of preeclampsia, prior pregnancy, currently pregnant   Preterm labor symptoms and general obstetric precautions including but not limited to vaginal bleeding, contractions,  leaking of fluid and fetal movement were reviewed in detail with the patient. Please refer to After Visit Summary for other counseling recommendations.  Return in about 6 weeks (around 02/16/2017) for PP visit.  IOL on 10/31.   Scheryl DarterJames Yania Bogie, MD

## 2017-01-06 ENCOUNTER — Other Ambulatory Visit: Payer: Self-pay | Admitting: Advanced Practice Midwife

## 2017-01-06 ENCOUNTER — Encounter (HOSPITAL_COMMUNITY): Payer: Self-pay | Admitting: *Deleted

## 2017-01-06 ENCOUNTER — Telehealth (HOSPITAL_COMMUNITY): Payer: Self-pay | Admitting: *Deleted

## 2017-01-06 NOTE — Telephone Encounter (Signed)
Preadmission screen  

## 2017-01-11 ENCOUNTER — Encounter (HOSPITAL_COMMUNITY): Payer: Self-pay

## 2017-01-11 ENCOUNTER — Inpatient Hospital Stay (HOSPITAL_COMMUNITY): Payer: Medicaid Other | Admitting: Anesthesiology

## 2017-01-11 ENCOUNTER — Inpatient Hospital Stay (HOSPITAL_COMMUNITY)
Admission: RE | Admit: 2017-01-11 | Discharge: 2017-01-14 | DRG: 807 | Disposition: A | Discharge status: Home / Self Care | Payer: Medicaid Other | Source: Ambulatory Visit | Attending: Obstetrics and Gynecology | Admitting: Obstetrics and Gynecology

## 2017-01-11 DIAGNOSIS — O09299 Supervision of pregnancy with other poor reproductive or obstetric history, unspecified trimester: Secondary | ICD-10-CM

## 2017-01-11 DIAGNOSIS — O134 Gestational [pregnancy-induced] hypertension without significant proteinuria, complicating childbirth: Principal | ICD-10-CM | POA: Diagnosis present

## 2017-01-11 DIAGNOSIS — E669 Obesity, unspecified: Secondary | ICD-10-CM | POA: Diagnosis present

## 2017-01-11 DIAGNOSIS — Z3A37 37 weeks gestation of pregnancy: Secondary | ICD-10-CM | POA: Diagnosis not present

## 2017-01-11 DIAGNOSIS — O9952 Diseases of the respiratory system complicating childbirth: Secondary | ICD-10-CM | POA: Diagnosis present

## 2017-01-11 DIAGNOSIS — O99214 Obesity complicating childbirth: Secondary | ICD-10-CM | POA: Diagnosis present

## 2017-01-11 DIAGNOSIS — O133 Gestational [pregnancy-induced] hypertension without significant proteinuria, third trimester: Secondary | ICD-10-CM | POA: Diagnosis present

## 2017-01-11 DIAGNOSIS — J45909 Unspecified asthma, uncomplicated: Secondary | ICD-10-CM | POA: Diagnosis present

## 2017-01-11 LAB — CBC
HCT: 29.2 % — ABNORMAL LOW (ref 36.0–46.0)
HEMATOCRIT: 30.5 % — AB (ref 36.0–46.0)
HEMOGLOBIN: 9.9 g/dL — AB (ref 12.0–15.0)
HEMOGLOBIN: 9.5 g/dL — AB (ref 12.0–15.0)
MCH: 26.2 pg (ref 26.0–34.0)
MCH: 26.2 pg (ref 26.0–34.0)
MCHC: 32.5 g/dL (ref 30.0–36.0)
MCHC: 32.5 g/dL (ref 30.0–36.0)
MCV: 80.7 fL (ref 78.0–100.0)
MCV: 80.4 fL (ref 78.0–100.0)
Platelets: 260 10*3/uL (ref 150–400)
Platelets: 240 10*3/uL (ref 150–400)
RBC: 3.78 MIL/uL — AB (ref 3.87–5.11)
RBC: 3.63 MIL/uL — ABNORMAL LOW (ref 3.87–5.11)
RDW: 14.5 % (ref 11.5–15.5)
RDW: 14.6 % (ref 11.5–15.5)
WBC: 12.5 10*3/uL — AB (ref 4.0–10.5)
WBC: 13.4 10*3/uL — AB (ref 4.0–10.5)

## 2017-01-11 LAB — COMPREHENSIVE METABOLIC PANEL
ALBUMIN: 2.7 g/dL — AB (ref 3.5–5.0)
ALT: 10 U/L — ABNORMAL LOW (ref 14–54)
ANION GAP: 8 (ref 5–15)
AST: 18 U/L (ref 15–41)
Alkaline Phosphatase: 178 U/L — ABNORMAL HIGH (ref 38–126)
BUN: 9 mg/dL (ref 6–20)
CO2: 19 mmol/L — AB (ref 22–32)
Calcium: 8.6 mg/dL — ABNORMAL LOW (ref 8.9–10.3)
Chloride: 108 mmol/L (ref 101–111)
Creatinine, Ser: 0.52 mg/dL (ref 0.44–1.00)
GFR calc non Af Amer: >60 mL/min (ref >60)
GLUCOSE: 131 mg/dL — AB (ref 65–99)
POTASSIUM: 4 mmol/L (ref 3.5–5.1)
SODIUM: 135 mmol/L (ref 135–145)
TOTAL PROTEIN: 6.1 g/dL — AB (ref 6.5–8.1)
Total Bilirubin: 0.1 mg/dL — ABNORMAL LOW (ref 0.3–1.2)

## 2017-01-11 LAB — RPR: RPR Ser Ql: NONREACTIVE

## 2017-01-11 LAB — TYPE AND SCREEN
ABO/RH(D): O POS
ANTIBODY SCREEN: NEGATIVE

## 2017-01-11 LAB — ABO/RH: ABO/RH(D): O POS

## 2017-01-11 LAB — PROTEIN / CREATININE RATIO, URINE
CREATININE, URINE: 179 mg/dL
PROTEIN CREATININE RATIO: 0.08 mg/mg{creat} (ref 0.00–0.15)
TOTAL PROTEIN, URINE: 14 mg/dL

## 2017-01-11 MED ORDER — EPHEDRINE 5 MG/ML INJ
10.0000 mg | INTRAVENOUS | Status: DC | PRN
  Filled 2017-01-11: qty 2

## 2017-01-11 MED ORDER — OXYTOCIN 40 UNITS IN LACTATED RINGERS INFUSION - SIMPLE MED
1.0000 m[IU]/min | INTRAVENOUS | Status: DC
  Administered 2017-01-11: 2 m[IU]/min via INTRAVENOUS

## 2017-01-11 MED ORDER — FENTANYL 2.5 MCG/ML BUPIVACAINE 1/10 % EPIDURAL INFUSION (WH - ANES)
14.0000 mL/h | INTRAMUSCULAR | Status: DC | PRN
  Administered 2017-01-11 – 2017-01-12 (×2): 14 mL/h via EPIDURAL
  Filled 2017-01-11 (×2): qty 100

## 2017-01-11 MED ORDER — ONDANSETRON HCL 4 MG/2ML IJ SOLN: 4.0000 mg | Freq: Four times a day (QID) | INTRAMUSCULAR | Status: DC | PRN

## 2017-01-11 MED ORDER — MISOPROSTOL 25 MCG QUARTER TABLET
25.0000 ug | ORAL_TABLET | ORAL | Status: DC | PRN
  Administered 2017-01-11 (×2): 25 ug via VAGINAL
  Filled 2017-01-11 (×3): qty 1

## 2017-01-11 MED ORDER — BUTORPHANOL TARTRATE 1 MG/ML IJ SOLN
2.0000 mg | Freq: Once | INTRAMUSCULAR | Status: AC
  Administered 2017-01-11: 2 mg via INTRAVENOUS
  Filled 2017-01-11: qty 2

## 2017-01-11 MED ORDER — LIDOCAINE HCL (PF) 1 % IJ SOLN
INTRAMUSCULAR | Status: DC | PRN
  Administered 2017-01-11: 7 mL via EPIDURAL
  Administered 2017-01-11: 5 mL via EPIDURAL

## 2017-01-11 MED ORDER — PHENYLEPHRINE 40 MCG/ML (10ML) SYRINGE FOR IV PUSH (FOR BLOOD PRESSURE SUPPORT)
80.0000 ug | PREFILLED_SYRINGE | INTRAVENOUS | Status: DC | PRN
  Filled 2017-01-11: qty 5
  Filled 2017-01-11: qty 10

## 2017-01-11 MED ORDER — LACTATED RINGERS IV SOLN: 500.0000 mL | INTRAVENOUS | Status: DC | PRN

## 2017-01-11 MED ORDER — LACTATED RINGERS IV SOLN
INTRAVENOUS | Status: DC
  Administered 2017-01-11 (×2): via INTRAVENOUS

## 2017-01-11 MED ORDER — OXYCODONE-ACETAMINOPHEN 5-325 MG PO TABS: 2.0000 | ORAL_TABLET | ORAL | Status: DC | PRN

## 2017-01-11 MED ORDER — DIPHENHYDRAMINE HCL 50 MG/ML IJ SOLN
12.5000 mg | INTRAMUSCULAR | Status: DC | PRN
  Administered 2017-01-12: 12.5 mg via INTRAVENOUS
  Filled 2017-01-11: qty 1

## 2017-01-11 MED ORDER — PHENYLEPHRINE 40 MCG/ML (10ML) SYRINGE FOR IV PUSH (FOR BLOOD PRESSURE SUPPORT)
80.0000 ug | PREFILLED_SYRINGE | INTRAVENOUS | Status: DC | PRN
  Administered 2017-01-12 (×2): 80 ug via INTRAVENOUS
  Filled 2017-01-11: qty 5

## 2017-01-11 MED ORDER — ACETAMINOPHEN 325 MG PO TABS
650.0000 mg | ORAL_TABLET | ORAL | Status: DC | PRN
  Administered 2017-01-12 (×2): 650 mg via ORAL
  Filled 2017-01-11 (×2): qty 2

## 2017-01-11 MED ORDER — LIDOCAINE HCL (PF) 1 % IJ SOLN
30.0000 mL | INTRAMUSCULAR | Status: DC | PRN
  Filled 2017-01-11: qty 30

## 2017-01-11 MED ORDER — LACTATED RINGERS IV SOLN: 500.0000 mL | Freq: Once | INTRAVENOUS | Status: DC

## 2017-01-11 MED ORDER — OXYTOCIN 40 UNITS IN LACTATED RINGERS INFUSION - SIMPLE MED
2.5000 [IU]/h | INTRAVENOUS | Status: DC
  Filled 2017-01-11: qty 1000

## 2017-01-11 MED ORDER — FENTANYL CITRATE (PF) 100 MCG/2ML IJ SOLN
100.0000 ug | INTRAMUSCULAR | Status: DC | PRN
  Administered 2017-01-11 (×5): 100 ug via INTRAVENOUS
  Filled 2017-01-11 (×5): qty 2

## 2017-01-11 MED ORDER — OXYTOCIN BOLUS FROM INFUSION
500.0000 mL | Freq: Once | INTRAVENOUS | Status: AC
  Administered 2017-01-12: 500 mL via INTRAVENOUS

## 2017-01-11 MED ORDER — SOD CITRATE-CITRIC ACID 500-334 MG/5ML PO SOLN: 30.0000 mL | ORAL | Status: DC | PRN

## 2017-01-11 MED ORDER — TERBUTALINE SULFATE 1 MG/ML IJ SOLN
0.2500 mg | Freq: Once | INTRAMUSCULAR | Status: DC | PRN
  Filled 2017-01-11: qty 1

## 2017-01-11 MED ORDER — OXYCODONE-ACETAMINOPHEN 5-325 MG PO TABS: 1.0000 | ORAL_TABLET | ORAL | Status: DC | PRN

## 2017-01-11 MED ORDER — PROMETHAZINE HCL 25 MG/ML IJ SOLN
12.5000 mg | Freq: Four times a day (QID) | INTRAMUSCULAR | Status: DC | PRN
  Administered 2017-01-11: 12.5 mg via INTRAVENOUS
  Filled 2017-01-11: qty 1

## 2017-01-11 NOTE — H&P (Signed)
Breanna Carrillo is a 21 y.o. female G2P1001 @ 37.0wks presenting for IOL due to gHTN. Denies leaking or bldg; reports +FM. Seeing some 'spots'. H/A at times- relieved with Tylenol (intermittent since 26wks). Her preg has been followed by the [facility] service and has been remarkable for 1) obesity 2) gHTN 3) hx of pre-e 4) GBS neg 5) hx asthma  OB History    Gravida Para Term Preterm AB Living   2 1 1     1    SAB TAB Ectopic Multiple Live Births           1     Past Medical History:  Diagnosis Date  . Asthma   . Pregnancy induced hypertension    Past Surgical History:  Procedure Laterality Date  . JOINT REPLACEMENT     R elbow   Family History: family history includes Diabetes in her father and maternal grandmother; Hypertension in her father, maternal grandmother, and paternal grandmother. Social History:  reports that she has never smoked. She has never used smokeless tobacco. She reports that she does not drink alcohol or use drugs.     Maternal Diabetes: No Genetic Screening: Normal Maternal Ultrasounds/Referrals: Normal Fetal Ultrasounds or other Referrals:  None Maternal Substance Abuse:  No Significant Maternal Medications:  None Significant Maternal Lab Results:  Lab values include: Group B Strep negative Other Comments:  None  ROS History Dilation: 1.5 Effacement (%): 50 Station: -3 Exam by:: Kris HartmannNicole Renzulli, RN Blood pressure (!) 101/53, pulse (!) 111, temperature 98.2 F (36.8 C), temperature source Oral, resp. rate 16, height 5\' 6"  (1.676 m), weight 107.5 kg (237 lb), last menstrual period 04/27/2016, SpO2 98 %, unknown if currently breastfeeding. Exam Physical Exam  Constitutional: She is oriented to person, place, and time. She appears well-developed.  HENT:  Head: Normocephalic.  Neck: Normal range of motion.  Cardiovascular: Normal rate.   Respiratory: Effort normal.  GI:  FHR 120s, +accels, no decels Some UI  Neurological: She is alert and oriented to  person, place, and time.  Skin: Skin is warm and dry.  Psychiatric: She has a normal mood and affect. Her behavior is normal. Thought content normal.    Prenatal labs: ABO, Rh: --/--/O POS (10/31 0802) Antibody: NEG (10/31 0802) Rubella: 1.07 (05/16 1052) RPR: Non Reactive (10/31 0802)  HBsAg: Negative (05/16 1052)  HIV:   NR (5/16) GBS: Negative (10/18 1011)   Assessment/Plan: IUP@37 .0 wks gHTN Unfavorable cx  [facility] Plan cx ripening with combination cervical foley, cytotec, AROM, Pit Collecting urine P/C ratio Anticipate SVD   Brittnei Jagiello CNM 01/11/2017, 2:14 PM

## 2017-01-11 NOTE — Progress Notes (Signed)
Labor Progress Note Charlcie CradleKristin B Edgecombe is a 21 y.o. G2P1001 at 3130w0d presented for IOL due to gHTN  S: Pt is resting in bed and in slight discomfort. States she is ready for her epidural and fentanyl.   O:  BP (!) 118/56   Pulse (!) 109   Temp 97.8 F (36.6 C) (Oral)   Resp 16   Ht 5\' 6"  (1.676 m)   Wt 107.5 kg (237 lb)   LMP 04/27/2016   SpO2 99%   BMI 38.25 kg/m  EFM: 140 baseline/mod variability/ + accels, no decels  CVE: Dilation: 4 Effacement (%): 50 Cervical Position: Posterior Station: -3 Presentation: Vertex Exam by:: Barrie DunkerMurayyah Johnson RN   A&P: 21 y.o. G2P1001 2930w0d admitted for OIL due to gHTN. Pt is in some discomfort so will give epidural. Folley bulb came out so will start patient on pit. Will monitor for any advancement.  #Labor: Progressing well. Folley bulb came out. Pt ctx every 2-4 hours.  #Pain: Epidural #FWB: Cat 1 #GBS negative BP under controlled (last BP 119/63) Hx of pre-e  Steffanie RainwaterProsper M Amponsah, Medical Student 10:21 PM  I have read the above note and agree with its contents and made necessary changes. I have performed my own evaluation of this patient and agree with the assessment and plan noted above.  Jules Schickim Antavious Spanos, DO PGY-1, [facility]

## 2017-01-11 NOTE — Progress Notes (Signed)
Labor Progress Note Breanna Carrillo is a 21 y.o. G2P1001 at 5620w0d presented for IOL due to gHTN S: Patient states that she is uncomfortable and in pain. Nervous about cervical checks, but managing well  O:  BP (!) 118/56   Pulse (!) 109   Temp 98.2 F (36.8 C) (Oral)   Resp 16   Ht 5\' 6"  (1.676 m)   Wt 237 lb (107.5 kg)   LMP 04/27/2016   SpO2 97%   BMI 38.25 kg/m  EFM: 140/mod vari/reactive CVE: Dilation: 1.5 Effacement (%): 50 Cervical Position: Posterior Station: -3 Presentation: Vertex Exam by:: Breanna HartmannNicole Roseboom, RN   A&P: 21 y.o. G2P1001 8620w0d IOL due to gHTN #Labor: Progressing well. Foley bulb placed plan for pit once foley bulb is out #Pain: moderately managed. Per patient request #FWB: cat 1 #GBS negative #gHTN: BP is well controlled last value is 118/56   Suella BroadKeriann S Lonnell Chaput, MD 7:31 PM

## 2017-01-11 NOTE — Anesthesia Preprocedure Evaluation (Signed)
Anesthesia Evaluation  Patient identified by MRN, date of birth, ID band Patient awake    Reviewed: Allergy & Precautions, H&P , NPO status , Patient's Chart, lab work & pertinent test results  Airway Mallampati: II  TM Distance: >3 FB Neck ROM: full    Dental no notable dental hx. (+) Teeth Intact   Pulmonary    Pulmonary exam normal breath sounds clear to auscultation       Cardiovascular hypertension, Normal cardiovascular exam Rhythm:regular Rate:Normal     Neuro/Psych negative neurological ROS  negative psych ROS   GI/Hepatic negative GI ROS, Neg liver ROS,   Endo/Other  negative endocrine ROS  Renal/GU negative Renal ROS     Musculoskeletal   Abdominal (+) + obese,   Peds  Hematology negative hematology ROS (+)   Anesthesia Other Findings   Reproductive/Obstetrics (+) Pregnancy                             Anesthesia Physical Anesthesia Plan  ASA: II  Anesthesia Plan: Epidural   Post-op Pain Management:    Induction:   PONV Risk Score and Plan:   Airway Management Planned:   Additional Equipment:   Intra-op Plan:   Post-operative Plan:   Informed Consent: I have reviewed the patients History and Physical, chart, labs and discussed the procedure including the risks, benefits and alternatives for the proposed anesthesia with the patient or authorized representative who has indicated his/her understanding and acceptance.     Plan Discussed with:   Anesthesia Plan Comments:         Anesthesia Quick Evaluation

## 2017-01-11 NOTE — Anesthesia Procedure Notes (Signed)
Epidural Patient location during procedure: OB Start time: 01/11/2017 11:40 PM End time: 01/11/2017 11:45 PM  Staffing Anesthesiologist: Leilani AbleHATCHETT, Jacquelina Hewins Performed: anesthesiologist   Preanesthetic Checklist Completed: patient identified, surgical consent, pre-op evaluation, timeout performed, IV checked, risks and benefits discussed and monitors and equipment checked  Epidural Patient position: sitting Prep: site prepped and draped and DuraPrep Patient monitoring: continuous pulse ox and blood pressure Approach: midline Location: L3-L4 Injection technique: LOR air  Needle:  Needle type: Tuohy  Needle gauge: 17 G Needle length: 9 cm and 9 Needle insertion depth: 9 cm Catheter type: closed end flexible Catheter size: 19 Gauge Catheter at skin depth: 15 cm Test dose: negative and Other  Assessment Sensory level: T9 Events: blood not aspirated, injection not painful, no injection resistance, negative IV test and no paresthesia

## 2017-01-11 NOTE — H&P (Signed)
LABOR AND DELIVERY ADMISSION HISTORY AND PHYSICAL NOTE  Charlcie CradleKristin B Dunleavy is a 21 y.o. female G2P1001 with IUP at 2526w0d by LMP presenting for IOL Gestational Hypertension.  She reports positive fetal movement. She denies leakage of fluid or vaginal bleeding. She affirms changes in vision ("seeing spots"), frontal headaches (7/10); treats with Tylenol, and persistent RUQ pain. States she is having bilateral lower abdominal quadrant pain that has started in the last 24 hours. Reports some shortness of breath with pain.   Pren[facility] History/Complications[facility] Pregnancy complications:  - Preeclampsia in previous Pregnancy - Gestational Hypertension - Ruptured Ovarian Cyst, Right Side ("April or May 2018") - Hyperemesis gravidarum - Vaginal Bleeding, 1st and 2nd Trimester - Asthma (History of asthma attack during previous labor) "Baby had breathing troubles right after delivery"  Past Medical History: Past Medical History:  Diagnosis Date  . Asthma   . Pregnancy induced hypertension     Past Surgical History: Past Surgical History:  Procedure Laterality Date  . JOINT REPLACEMENT     R elbow    Obstetrical History: OB History    Gravida Para Term Preterm AB Living   2 1 1     1    SAB TAB Ectopic Multiple Live Births           1      Social History: Social History   Social History  . Marital status: Single    Spouse name: N/A  . Number of children: N/A  . Years of education: N/A   Social History Main Topics  . Smoking status: Never Smoker  . Smokeless tobacco: Never Used  . Alcohol use No  . Drug use: No  . Sexual activity: Yes   Other Topics Concern  . None   Social History Narrative  . None    Family History: Family History  Problem Relation Age of Onset  . Hypertension Father   . Diabetes Father   . Diabetes Maternal Grandmother   . Hypertension Maternal Grandmother   . Hypertension Paternal Grandmother     Allergies: No  Known Allergies  Prescriptions Prior to Admission  Medication Sig Dispense Refill Last Dose  . cyclobenzaprine (FLEXERIL) 10 MG tablet Take 1 tablet (10 mg total) by mouth every 8 (eight) hours as needed for muscle spasms. 30 tablet 2 Past Month at Unknown time  . Prenatal Vit-Fe Fumarate-FA (PRENATAL MULTIVITAMIN) TABS tablet Take 1 tablet by mouth daily at 12 noon.   01/10/2017 at Unknown time  . aspirin EC 81 MG tablet Take 1 tablet (81 mg total) by mouth daily. (Patient not taking: Reported on 01/11/2017) 30 tablet 8 Completed Course at Unknown time  . pantoprazole (PROTONIX) 40 MG tablet Take 1 tablet (40 mg total) by mouth daily. (Patient not taking: Reported on 01/11/2017) 30 tablet 2 Not Taking at Unknown time     Review of Systems  All systems reviewed and negative except as stated in HPI  Physical Exam Blood pressure 111/68, pulse (!) 121, temperature 98 F (36.7 C), temperature source Oral, resp. rate 18, height 5\' 6"  (1.676 m), weight 107.5 kg (237 lb), last menstrual period 04/27/2016, unknown if currently breastfeeding. General appearance: alert, cooperative and no distress Lungs: clear to auscultation bilaterally Heart: regular rate and rhythm Abdomen: soft, non-tender; bowel sounds normal Extremities: No calf swelling or tenderness Presentation: cephalic  Fetal monitoring: Category 1. 120s. Moderate Variability. Some accels. No decels. No CTX.  Uterine activity: Active, moderate Dilation: 1 Effacement (%):  50 Station: -2 Exam by:: Kittie Plater, RN  Prenatal labs: ABO, Rh: --/--/O POS (10/31 0802) Antibody: NEG (10/31 0802) Rubella: 1.07 (05/16 1052) RPR: Non Reactive (09/04 0930)  HBsAg: Negative (05/16 1052)  HIV:    GC/Chlamydia: Negative (12/29/16) GBS: Negative (10/18 1011)  1 hr Glucola: Normal (76; 11/15/16) Genetic screening:  Quad Screen, Normal Anatomy US: Norma. Multiple, monthly BPPs in third trimester. 8/10 (breathing not sustained)  Prenatal  Transfer Tool  Maternal Diabetes: No Genetic Screening: Normal Maternal Ultrasounds/Referrals: Normal Fetal Ultrasounds or other Referrals:  Referred to Materal Fetal Medicine  Maternal Substance Abuse:  No Significant Maternal Medications:  Meds include: Protonix Significant Maternal Lab Results: Lab values include: Group B Strep negative  Results for orders placed or performed during the hospital encounter of 01/11/17 (from the past 24 hour(s))  ABO/Rh   Collection Time: 01/11/17  7:55 AM  Result Value Ref Range   ABO/RH(D) O POS   CBC   Collection Time: 01/11/17  8:02 AM  Result Value Ref Range   WBC 12.5 (H) 4.0 - 10.5 K/uL   RBC 3.78 (L) 3.87 - 5.11 MIL/uL   Hemoglobin 9.9 (L) 12.0 - 15.0 g/dL   HCT 95.2 (L) 84.1 - 32.4 %   MCV 80.7 78.0 - 100.0 fL   MCH 26.2 26.0 - 34.0 pg   MCHC 32.5 30.0 - 36.0 g/dL   RDW 40.1 02.7 - 25.3 %   Platelets 260 150 - 400 K/uL  Comprehensive metabolic panel   Collection Time: 01/11/17  8:02 AM  Result Value Ref Range   Sodium 135 135 - 145 mmol/L   Potassium 4.0 3.5 - 5.1 mmol/L   Chloride 108 101 - 111 mmol/L   CO2 19 (L) 22 - 32 mmol/L   Glucose, Bld 131 (H) 65 - 99 mg/dL   BUN 9 6 - 20 mg/dL   Creatinine, Ser 6.64 0.44 - 1.00 mg/dL   Calcium 8.6 (L) 8.9 - 10.3 mg/dL   Total Protein 6.1 (L) 6.5 - 8.1 g/dL   Albumin 2.7 (L) 3.5 - 5.0 g/dL   AST 18 15 - 41 U/L   ALT 10 (L) 14 - 54 U/L   Alkaline Phosphatase 178 (H) 38 - 126 U/L   Total Bilirubin 0.1 (L) 0.3 - 1.2 mg/dL   GFR calc non Af Amer >60 >60 mL/min   GFR calc Af Amer >60 >60 mL/min   Anion gap 8 5 - 15  Type and screen   Collection Time: 01/11/17  8:02 AM  Result Value Ref Range   ABO/RH(D) O POS    Antibody Screen NEG    Sample Expiration 01/14/2017     Patient Active Problem List   Diagnosis Date Noted  . Gestational hypertension, third trimester 01/11/2017  . Gestational hypertension, antepartum 11/22/2016  . Hx of preeclampsia, prior pregnancy, currently  pregnant 08/24/2016  . Obesity affecting pregnancy, antepartum 08/10/2016  . Supervision of high risk pregnancy, antepartum 07/27/2016  . Hyperemesis gravidarum 07/25/2016  . Hypercholesterolemia 05/04/2016  . Obesity (BMI 35.0-39.9 without comorbidity) 05/04/2016    Assessment: AKSHAYA TOEPFER is a 21 y.o. G2P1001 at [redacted]w[redacted]d here for IOL for gestational hypertension.  #Labor: IOL, NSVD #Pain: Epidural scheduled. As requested per patient.  #FWB: Category 1 #ID:  Group B Negative #MOF: Breast and Bottle #MOC:Interested in LARCs, Implant #Circ:  Yes. Timing undecided.  Huel Cote Dayvin Aber 01/11/2017, 11:50 AM

## 2017-01-11 NOTE — Anesthesia Pain Management Evaluation Note (Signed)
  CRNA Pain Management Visit Note  Patient: Breanna CradleKristin B Basaldua, 21 y.o., female  "Hello [facility]. We have an anesthesia team available at all times to provide care throughout the hospital, including epidural management and anesthesia for C-section. I don't know your plan for the delivery whether it a natural birth, water birth, IV sedation, nitrous supplementation, doula or epidural, but we want to meet your pain goals."   1.Was your pain managed to your expectations on prior hospitalizations?   Yes   2.What is your expectation for pain management during this hospitalization?     Epidural  3.How can we help you reach that goal?   Record the patient's initial score and the patient's pain goal.   Pain: 0  Pain Goal: 5 [facility] wants you to be able to say your pain was always managed very well.  Gavynn Duvall Hristova 01/11/2017

## 2017-01-12 ENCOUNTER — Other Ambulatory Visit: Payer: Medicaid Other

## 2017-01-12 ENCOUNTER — Encounter: Payer: Medicaid Other | Admitting: Obstetrics and Gynecology

## 2017-01-12 ENCOUNTER — Encounter (HOSPITAL_COMMUNITY): Payer: Self-pay

## 2017-01-12 DIAGNOSIS — O134 Gestational [pregnancy-induced] hypertension without significant proteinuria, complicating childbirth: Secondary | ICD-10-CM

## 2017-01-12 DIAGNOSIS — Z3A37 37 weeks gestation of pregnancy: Secondary | ICD-10-CM

## 2017-01-12 LAB — CBC
HCT: 27.9 % — ABNORMAL LOW (ref 36.0–46.0)
Hemoglobin: 9.1 g/dL — ABNORMAL LOW (ref 12.0–15.0)
MCH: 26.1 pg (ref 26.0–34.0)
MCHC: 32.6 g/dL (ref 30.0–36.0)
MCV: 80.2 fL (ref 78.0–100.0)
PLATELETS: 214 10*3/uL (ref 150–400)
RBC: 3.48 MIL/uL — AB (ref 3.87–5.11)
RDW: 14.5 % (ref 11.5–15.5)
WBC: 16.6 10*3/uL — AB (ref 4.0–10.5)

## 2017-01-12 MED ORDER — TETANUS-DIPHTH-ACELL PERTUSSIS 5-2.5-18.5 LF-MCG/0.5 IM SUSP: 0.5000 mL | Freq: Once | INTRAMUSCULAR | Status: DC

## 2017-01-12 MED ORDER — ONDANSETRON HCL 4 MG PO TABS: 4.0000 mg | ORAL_TABLET | ORAL | Status: DC | PRN

## 2017-01-12 MED ORDER — SENNOSIDES-DOCUSATE SODIUM 8.6-50 MG PO TABS
2.0000 | ORAL_TABLET | ORAL | Status: DC
  Administered 2017-01-12 – 2017-01-13 (×2): 2 via ORAL
  Filled 2017-01-12 (×2): qty 2

## 2017-01-12 MED ORDER — COCONUT OIL OIL
1.0000 "application " | TOPICAL_OIL | Status: DC | PRN
  Administered 2017-01-14: 1 via TOPICAL
  Filled 2017-01-12: qty 120

## 2017-01-12 MED ORDER — ZOLPIDEM TARTRATE 5 MG PO TABS: 5.0000 mg | ORAL_TABLET | Freq: Every evening | ORAL | Status: DC | PRN

## 2017-01-12 MED ORDER — DIPHENHYDRAMINE HCL 25 MG PO CAPS: 25.0000 mg | ORAL_CAPSULE | Freq: Four times a day (QID) | ORAL | Status: DC | PRN

## 2017-01-12 MED ORDER — IBUPROFEN 600 MG PO TABS
600.0000 mg | ORAL_TABLET | Freq: Four times a day (QID) | ORAL | Status: DC
  Administered 2017-01-12 – 2017-01-14 (×8): 600 mg via ORAL
  Filled 2017-01-12 (×8): qty 1

## 2017-01-12 MED ORDER — ACETAMINOPHEN 325 MG PO TABS
650.0000 mg | ORAL_TABLET | ORAL | Status: DC | PRN
  Administered 2017-01-12 – 2017-01-13 (×3): 650 mg via ORAL
  Filled 2017-01-12 (×2): qty 2

## 2017-01-12 MED ORDER — HYDROXYZINE HCL 50 MG PO TABS
25.0000 mg | ORAL_TABLET | Freq: Once | ORAL | Status: AC
  Administered 2017-01-12: 25 mg via ORAL
  Filled 2017-01-12: qty 1

## 2017-01-12 MED ORDER — DIBUCAINE 1 % RE OINT: 1.0000 "application " | TOPICAL_OINTMENT | RECTAL | Status: DC | PRN

## 2017-01-12 MED ORDER — WITCH HAZEL-GLYCERIN EX PADS: 1.0000 "application " | MEDICATED_PAD | CUTANEOUS | Status: DC | PRN

## 2017-01-12 MED ORDER — PRENATAL MULTIVITAMIN CH
1.0000 | ORAL_TABLET | Freq: Every day | ORAL | Status: DC
  Administered 2017-01-13 – 2017-01-14 (×2): 1 via ORAL
  Filled 2017-01-12 (×2): qty 1

## 2017-01-12 MED ORDER — SIMETHICONE 80 MG PO CHEW: 80.0000 mg | CHEWABLE_TABLET | ORAL | Status: DC | PRN

## 2017-01-12 MED ORDER — ONDANSETRON HCL 4 MG/2ML IJ SOLN: 4.0000 mg | INTRAMUSCULAR | Status: DC | PRN

## 2017-01-12 MED ORDER — BENZOCAINE-MENTHOL 20-0.5 % EX AERO
1.0000 "application " | INHALATION_SPRAY | CUTANEOUS | Status: DC | PRN
  Administered 2017-01-12: 1 via TOPICAL
  Filled 2017-01-12: qty 56

## 2017-01-12 NOTE — Lactation Note (Signed)
This note was copied from a baby's chart. Lactation Consultation Note  Patient Name: Breanna Carrillo ZOXWR'UToday's Date: 01/12/2017 Reason for consult: Initial assessment   P2,baby 9 hours old. Experienced breastfeeding for 2.5 years.  Baby 5030w1d and spitty. Baby gagging and spitty when LC entered room.  Mother states baby has not been interested in feeding. Reviewed hand expression with mother.  Spoon/finger syringe fed baby to demonstrate and gave baby approx 5 ml. Encouraged spoon feeding and pumping at least q 3 hours until baby is latching and swallows observed. During lactation consult NT came in give baby bath and to take baby to nursery to perform heart screen.  Mother asked NT if LC could demonstrate how to use DEBP first and then mother went with baby to the nursery for heart screen so consult was shortened. Baby has not breastfed since birth other than 5 ml of colostrum. Set up DEBP.  Recommend pumping 4-6 times per day for 10-20 min. Give baby back volume pumped and expressed. Discussed milk storage and cleaning. LPI information sheet given. Mom encouraged to feed baby 8-12 times/24 hours and with feeding cues at least q 3 hours.  Lactation brochure left in room for mother.      Maternal Data Has patient been taught Hand Expression?: Yes Does the patient have breastfeeding experience prior to this delivery?: Yes  Feeding    LATCH Score                   Interventions Interventions: Breast feeding basics reviewed;Hand express;Expressed milk;DEBP  Lactation Tools Discussed/Used Pump Review: Setup, frequency, and cleaning;Milk Storage Initiated by:: Breanna Byesuth Berkelhammer RN IBCLC Date initiated:: 01/12/17   Consult Status Consult Status: Follow-up Date: 01/13/17 Follow-up type: In-patient    Breanna Carrillo, Breanna Carrillo 01/12/2017, 10:22 PM

## 2017-01-12 NOTE — Progress Notes (Signed)
Labor Progress Note Breanna CradleKristin B Carrillo is a 21 y.o. G2P1001 at 5976w1d admitted for IOL for high-risk pregnancy due to gHTN  S: Pt is resting comfortably in bed. States she is not feeling the contractions due to the epidural.   O:  BP (!) 100/56   Pulse (!) 119   Temp 98.7 F (37.1 C) (Oral)   Resp 16   Ht 5\' 6"  (1.676 m)   Wt 107.5 kg (237 lb)   LMP 04/27/2016   SpO2 99%   BMI 38.25 kg/m  EFM: 150 baseline/mod variability/ + accels, no decels  CVE: Dilation: 4 Effacement (%): 50 Cervical Position: Posterior Station: -3 Presentation: Vertex Exam by:: Barrie Dunkermurayyah Johnson RN   A&P: 21 y.o. G2P1001 3876w1d admitted for IOL for high-risk pregnancy due to gHTN. Pt's pain is well controlled with epidural. Recent BP was 100/56.  #Labor: Progressing well. Not feeling any ctx at this time. #Pain: Well-control with epidural #FWB: Cat 1 #GBS negative Hx of pre-e  Steffanie RainwaterProsper M Amponsah, Medical Student 2:27 AM  I have read and agree with the above note. I have performed my own physical exam and assessment of the patient and am in agreement with the plan noted above.  Jules Schickim Rayah Fines, DO PGY-1, [facility]

## 2017-01-12 NOTE — Progress Notes (Signed)
Labor Progress Note Breanna CradleKristin B Carrillo is a 21 y.o. G2P1001 at 5873w1d presented for IOL for high-risk pregnancy due to gHTN S: Pt reports headache and blurry vision at the moment, but blood pressure is stable at 116/51. She states that she is nervous because she does not want to push for 3 hrs. She was reassured that we will try to ensure that she labors down before needing to push.  O:  BP (!) 116/51   Pulse (!) 126   Temp 98.3 F (36.8 C)   Resp 20   Ht 5\' 6"  (1.676 m)   Wt 237 lb (107.5 kg)   LMP 04/27/2016   SpO2 99%   BMI 38.25 kg/m  EFM: 140/mod vari/early decels  CVE: Dilation: 8.5 Effacement (%): 90 Cervical Position: Posterior Station: -2, -1 Presentation: Vertex Exam by:: Viona GilmoreS Moyer    A&P: 21 y.o. G2P1001 1473w1d here for IOL for high-risk pregnancy due to gHTN #Labor: Progressing well. Pit #Pain: Epidural #FWB: cat 1 #GBS negative #gHTN: BP well controlled at the moment with last value 116/51. Will continue to monitor closely.  Suella BroadKeriann S Jamaris Theard, MD 10:09 AM

## 2017-01-12 NOTE — Progress Notes (Signed)
Patient ID: Charlcie CradleKristin B Carrillo, female   DOB: 06/04/1995, 21 y.o.   MRN: [id number]  Comfortable w/ epidural, though worried about FHR going down; leaking fluid since 0449  BP 103/56, P 107 FHR 135-145, +accels, occ variables Ctx irreg 2-6 mins w/ Pit @ 286mu/min Cx 5/70/-2; +clear fluid present; internals placed  IUP@term  gHTN Latent labor  Titrate Pit for adequate MVUs Anticipate SVD  Cam HaiSHAW, KIMBERLY CNM 01/12/2017 6:32 AM

## 2017-01-12 NOTE — Anesthesia Postprocedure Evaluation (Signed)
Anesthesia Post Note  Patient: Breanna Carrillo  Procedure(s) Performed: AN AD HOC LABOR EPIDURAL     Patient location during evaluation: Mother Baby Anesthesia Type: Epidural Level of consciousness: awake and alert Pain management: pain level controlled Vital Signs Assessment: post-procedure vital signs reviewed and stable Respiratory status: spontaneous breathing Cardiovascular status: stable Postop Assessment: no headache, patient able to bend at knees, no backache, no apparent nausea or vomiting, epidural receding and adequate PO intake Anesthetic complications: no    Last Vitals:  Vitals:   01/12/17 1440 01/12/17 1540  BP: (!) 114/59 109/63  Pulse:  94  Resp: 19 18  Temp: 36.9 C   SpO2:  98%    Last Pain:  Vitals:   01/12/17 1650  TempSrc:   PainSc: 3    Pain Goal: Patients Stated Pain Goal: 3 (01/12/17 1650)               Jennye MoccasinSterling, Weldon Pickingharlesetta Marie

## 2017-01-12 NOTE — Progress Notes (Signed)
Ordered to cut pit in half to 6 miliunits

## 2017-01-13 NOTE — Lactation Note (Signed)
This note was copied from a baby's chart. Lactation Consultation Note  Patient Name: Breanna Carrillo Breanna Carrillo: 01/13/2017 Reason for consult: Follow-up assessment;Early term 3837-38.6wks  Baby is 26 hours at the start of the [facility] consult.  LC noted the baby to be lying in the crib suck strong on the pacifier.  Mom pumping both breast with the DEBP, and RN Vernona RiegerLaura at bedside  And reported she had just fed the baby 5 ml of EBM with a spoon.  Baby still acting hungry.  LC offered to assist mom with a latch, and mom preferred to use the left breast  LC reviewed hand expressing, and several large drops noted,diaper checked and dry,  Baby awake, alittle sluggish. LC assisted to latch in the football, baby baby latched, but noted  To be sluggish with sucking pattern.  LC felt since the baby has been sucking on the pacifier, using a Nipple Shield to transition him back  To the breast. LC sized mom for a #24 NS, instilled EBM in the top and latched the baby, baby was sluggish at 1st And then feeding pattern increased to being  more consistent and increased swallows noted with breast compressions.  Mom explained to mom this  may be the baby's sluggish time to feed but due to the baby being a LPT infant and hasn't  Fed since 12n before mom, time to feed.  Mom seemed pleased baby finally latched, and swallowing.  Mom mentioned she had been very successful with her 1st baby breast feeding ( now 498 years old ) and occasionally  Likes to latch. LC enc mom to continue pumping after feedings. If the baby is cluster feeding , to hold off on the pumping.  LC praised mom for her efforts pumping, milk is in, and prior to consult mom pumped off 25 plus ml.  Per mom exhausted and has been here since Tuesday and plans to take a nap after breast feeding.  LC washed pump pieces and placed then on a clean towel on the counter.  [facility] thanked [facility] for assistance with latch , and grandmother did too.     Maternal  Data Has patient been taught Hand Expression?: Yes  Feeding Feeding Type: Breast Fed Length of feed: 15 min (multiple swallows, incraesed with breast compressions )  LATCH Score Latch: Grasps breast easily, tongue down, lips flanged, rhythmical sucking.  Audible Swallowing: Spontaneous and intermittent  Type of Nipple: Everted at rest and after stimulation  Comfort (Breast/Nipple): Filling, red/small blisters or bruises, mild/mod discomfort  Hold (Positioning): Assistance needed to correctly position infant at breast and maintain latch.  LATCH Score: 8  Interventions Interventions: Breast feeding basics reviewed;Assisted with latch;Skin to skin;Breast massage;Hand express;Breast compression;Adjust position;Support pillows;Position options;DEBP  Lactation Tools Discussed/Used Tools: Pump (no NS used ) Nipple shield size: 24;Other (comment) Breast pump type: Double-Electric Breast Pump (enc mom to keep post pumping when the baby isn'y cluster feeding )   Consult Status Consult Status: Follow-up Carrillo: 01/14/17 Follow-up type: In-patient    Matilde SprangMargaret Ann Evert Wenrich 01/13/2017, 4:10 PM

## 2017-01-14 MED ORDER — IBUPROFEN 600 MG PO TABS: 600.0000 mg | ORAL_TABLET | Freq: Four times a day (QID) | ORAL | 0 refills | Status: DC

## 2017-01-14 MED ORDER — SENNOSIDES-DOCUSATE SODIUM 8.6-50 MG PO TABS: 2.0000 | ORAL_TABLET | ORAL | 0 refills | Status: DC

## 2017-01-14 NOTE — Lactation Note (Signed)
This note was copied from a baby's chart. Lactation Consultation Note  Patient Name: Breanna Carrillo [name] UJWJX'BToday's Date: 01/14/2017  Mom and baby ready for discharge.  Mom states baby is latching easily and well.  No nipple shield needed.  Nipples tender and slightly pink.  Mom using colostrum and coconut oil after feeds.  She breastfed her first baby for 2 1/2 years and feels confident.  Lactation outpatient services and support information reviewed and encouraged prn.   Maternal Data    Feeding    LATCH Score                   Interventions    Lactation Tools Discussed/Used     Consult Status      Huston FoleyMOULDEN, Rhys Lichty S 01/14/2017, 11:26 AM

## 2017-01-14 NOTE — Discharge Summary (Signed)
OB Discharge Summary     Patient Name: Breanna Carrillo DOB: 1995/12/02 MRN: [id number]  Date of admission: 01/11/2017 Delivering MD: Suella Broad   Date of discharge: 01/14/2017  Admitting diagnosis: INDUCTION Intrauterine pregnancy: [redacted]w[redacted]d     Secondary diagnosis:  Active Problems:   Gestational hypertension, third trimester  Additional problems: Patient Active Problem List   Diagnosis Date Noted  . Gestational hypertension, third trimester 01/11/2017  . Gestational hypertension, antepartum 11/22/2016  . Hx of preeclampsia, prior pregnancy, currently pregnant 08/24/2016  . Obesity affecting pregnancy, antepartum 08/10/2016  . Supervision of high risk pregnancy, antepartum 07/27/2016  . Hyperemesis gravidarum 07/25/2016  . Hypercholesterolemia 05/04/2016  . Obesity (BMI 35.0-39.9 without comorbidity) 05/04/2016       Discharge diagnosis: Term Pregnancy Delivered and Gestational Hypertension                                                                                                Post partum procedures:none  Induction: Pitocin, Cytotec and Foley Balloon  Complications: None  Hospital course:  Induction of Labor With Vaginal Delivery   21 y.o. yo [id number] at [redacted]w[redacted]d was admitted to the hospital 01/11/2017 for induction of labor.  Indication for induction: Gestational hypertension.  Patient had an uncomplicated labor course as follows: Membrane Rupture Time/Date: 4:49 AM ,01/12/2017   Intrapartum Procedures: Episiotomy: None [1]                                         Lacerations:  None [1]  Patient had delivery of a Viable infant.  Information for the patient's newborn:  Breanna, Carrillo [[id number]]  Delivery Method: Vag-Spont   01/12/2017  Details of delivery can be found in separate delivery note.  Patient had a routine postpartum course. Patient is discharged home 01/14/17. She has remained normotensive during her PP stay and has not required any  antihypertensives.  Physical exam  Vitals:   01/12/17 2007 01/13/17 0609 01/13/17 1730 01/14/17 0624  BP: 129/79 116/70 115/76 109/65  Pulse: 90 85 88 82  Resp: 18 16 16 18   Temp:  (!) 97.5 F (36.4 C) 98 F (36.7 C) 98.2 F (36.8 C)  TempSrc:   Oral Oral  SpO2:   99%   Weight:      Height:       General: alert, cooperative and no distress Lochia: appropriate Uterine Fundus: firm Incision: N/A DVT Evaluation: No evidence of DVT seen on physical exam. Labs: Lab Results  Component Value Date   WBC 16.6 (H) 01/12/2017   HGB 9.1 (L) 01/12/2017   HCT 27.9 (L) 01/12/2017   MCV 80.2 01/12/2017   PLT 214 01/12/2017   CMP Latest Ref Rng & Units 01/11/2017  Glucose 65 - 99 mg/dL 130(Q)  BUN 6 - 20 mg/dL 9  Creatinine 6.57 - 8.46 mg/dL 9.62  Sodium 952 - 841 mmol/L 135  Potassium 3.5 - 5.1 mmol/L 4.0  Chloride 101 - 111 mmol/L 108  CO2 22 - 32 mmol/L  19(L)  Calcium 8.9 - 10.3 mg/dL 0.4(V8.6(L)  Total Protein 6.5 - 8.1 g/dL 6.1(L)  Total Bilirubin 0.3 - 1.2 mg/dL 4.0(J0.1(L)  Alkaline Phos 38 - 126 U/L 178(H)  AST 15 - 41 U/L 18  ALT 14 - 54 U/L 10(L)    Discharge instruction: per After Visit Summary and "Baby and Me Booklet".  After visit meds:  Allergies as of 01/14/2017   No Known Allergies     Medication List    STOP taking these medications   aspirin EC 81 MG tablet   cyclobenzaprine 10 MG tablet Commonly known as:  FLEXERIL   pantoprazole 40 MG tablet Commonly known as:  PROTONIX     TAKE these medications   ibuprofen 600 MG tablet Commonly known as:  ADVIL,MOTRIN Take 1 tablet (600 mg total) by mouth every 6 (six) hours.   prenatal multivitamin Tabs tablet Take 1 tablet by mouth daily at 12 noon.   senna-docusate 8.6-50 MG tablet Commonly known as:  Senokot-S Take 2 tablets by mouth daily.       Diet: routine diet  Activity: Advance as tolerated. Pelvic rest for 6 weeks.   Outpatient follow up:1 week Baby Love BP check then 4wk PP visit Follow up  Appt:No future appointments. Follow up Visit:No Follow-up on file.  Postpartum contraception: IUD  Newborn Data: Live born female  Birth Weight: 7 lb 10.2 oz (3464 g) APGAR: 8, 9  Newborn Delivery   Birth date/time:  01/12/2017 12:18:00 Delivery type:  Vaginal, Spontaneous Delivery      Baby Feeding: Breast Disposition:home with mother   01/14/2017 Suella BroadKeriann S Minott, MD  CNM attestation I have seen and examined this patient and agree with above documentation in the resident's note.   Breanna CradleKristin B Difiore is a 21 y.o. [id number] s/p SVD.   Pain is well controlled.  Plan for birth control is IUD.  Method of Feeding: breast  PE:  BP 109/65   Pulse 82   Temp 98.2 F (36.8 C) (Oral)   Resp 18   Ht 5\' 6"  (1.676 m)   Wt 107.5 kg (237 lb)   LMP 04/27/2016   SpO2 99%   Breastfeeding? Unknown   BMI 38.25 kg/m  Fundus firm  No results for input(s): HGB, HCT in the last 72 hours.   Plan: discharge today - postpartum care discussed - f/u clinic in 4 weeks for postpartum visit, but will have Baby Love BP check in 1 week   Breanna Carrillo, CNM 11:57 PM

## 2017-01-14 NOTE — Discharge Instructions (Signed)

## 2017-01-30 ENCOUNTER — Ambulatory Visit (INDEPENDENT_AMBULATORY_CARE_PROVIDER_SITE_OTHER): Payer: Medicaid Other | Admitting: Clinical

## 2017-01-30 DIAGNOSIS — F4321 Adjustment disorder with depressed mood: Secondary | ICD-10-CM

## 2017-01-30 MED ORDER — SERTRALINE HCL 25 MG PO TABS: 25.0000 mg | ORAL_TABLET | Freq: Every day | ORAL | 0 refills | Status: DC

## 2017-01-30 MED ORDER — SERTRALINE HCL 50 MG PO TABS: 50.0000 mg | ORAL_TABLET | Freq: Every day | ORAL | 2 refills | Status: DC

## 2017-01-30 NOTE — BH Specialist Note (Signed)
Integrated Behavioral Health Initial Visit  MRN: [id number] Name: Breanna Carrillo  Number of Integrated Behavioral Health Clinician visits:: 1/6 Session Start time: 1:10  Session End time: 1:47 Total time: 30 minutes  Type of Service: Integrated Behavioral Health- Individual/Family Interpretor:No. Interpretor Name and Language: n/a   Warm Hand Off Completed.       SUBJECTIVE: Breanna Carrillo is a 21 y.o. female accompanied by 3yo daughter; newborn son Patient was referred by Dr Lurlean LeydenSionne Fernand ParkinsAnhelicia George, [facility]. Patient reports the following symptoms/concerns: Pt states her primary symptoms today are depression, fatigue, oversleeping, anhedonia, lack of appetite, and irritability. Pt did not experience these symptoms after previous birth, and does not have a personal or  family hx of BH issues. No SI, no HI.  Duration of problem: Starting 2 days postpartum; Severity of problem: moderate  OBJECTIVE: Mood: Depressed and Irritable and Affect: Depressed Risk of harm to self or others: No plan to harm self or others  LIFE CONTEXT: Family and Social: Lives with 3yo daughter, newborn son, in her mother's home; family is supportive School/Work: - Self-Care: - Life Changes: Recent childbirth  GOALS ADDRESSED: Patient will: 1. Reduce symptoms of: depression 2. Increase knowledge and/or ability of: coping skills  3. Demonstrate ability to: Increase healthy adjustment to current life circumstances  INTERVENTIONS: Interventions utilized: Solution-Focused Strategies and Psychoeducation and/or Health Education  Standardized Assessments completed: GAD-7 and PHQ 9  ASSESSMENT: Patient currently experiencing Adjustment disorder with depressed mood.   Patient may benefit from psychoeducation and brief therapeutic interventions regarding coping with symptoms of depression.  PLAN: 1. Follow up with behavioral health clinician on : Two days via  phone; two weeks in office 2. Behavioral recommendations:  -Pick up Zoloft at pharmacy today -Continue taking prenatal vitamins until postpartum visit(or when advised by medical provider to discontinue) -Enjoy Thanksgiving dinner with family this week -Consider sitting outside in sunshine for at least 5 minutes/day -Read educational material regarding coping with symptoms of depression and taking antidepressants 3. Referral(s): Integrated Behavioral Health Services (In Clinic) 4. "From scale of 1-10, how likely are you to follow plan?": 9  Rae LipsJamie C Rhylynn Perdomo, LCSWA  Dep[facility] 2/[facility] 01/05/2017 12/22/2016 12/15/2016 12/07/2016 11/29/2016  Decreased Interest 2 2 1 1 1   Down, Depressed, Hopeless 0 0 0 0 0  PHQ - 2 Score 2 2 1 1 1   Altered sleeping 2 2 1 1 2   Tired, decreased energy 2 2 1 1 1   Change in appetite 0 0 0 0 0  Feeling bad or failure about yourself  0 0 0 0 0  Trouble concentrating 0 0 0 0 0  Moving slowly or fidgety/restless 0 0 0 0 0  Suicidal thoughts 0 0 0 0 0  PHQ-9 Score 6 6 3 3 4    GAD 7 : Generalized Anxiety Score 01/05/2017 12/22/2016 12/15/2016 12/07/2016  Nervous, Anxious, on Edge 0 0 0 0  Control/stop worrying 0 0 0 0  Worry too much - different things 0 0 0 0  Trouble relaxing 2 2 1 1   Restless 2 2 1  0  Easily annoyed or irritable 1 0 1 0  Afraid - awful might happen 0 0 0 0  Total GAD 7 Score 5 4 3  1

## 2017-02-01 ENCOUNTER — Telehealth: Payer: Self-pay | Admitting: Clinical

## 2017-02-01 NOTE — Telephone Encounter (Signed)
Attempt BH Med Management f/u call; left HIPPA-compliant message to return call to BaudetteJamie [facility] at [phone]

## 2017-02-13 ENCOUNTER — Ambulatory Visit: Payer: Self-pay

## 2017-02-13 NOTE — BH Specialist Note (Deleted)
Integrated Behavioral Health Follow Up Visit  MRN: [id number] Name: Breanna Carrillo  Number of Integrated Behavioral Health Clinician visits: 2/6 Session Start time: ***  Session End time: *** Total time: {IBH Total Time:21014050}  Type of Service: Integrated Behavioral Health- Individual/Family Interpretor:No. Interpretor Name and Language: n/a  SUBJECTIVE: Breanna Carrillo is a 21 y.o. female accompanied by {Patient accompanied by:[phone]} Patient was referred by Dr Genelle GatherAnhelicia for postpartum depression. Patient reports the following symptoms/concerns: *** Duration of problem: ***; Severity of problem: {Mild/Moderate/Severe:20260}  OBJECTIVE: Mood: {BHH MOOD:22306} and Affect: {BHH AFFECT:22307} Risk of harm to self or others: {CHL AMB BH Suicide Current Mental Status:21022748}  LIFE CONTEXT: Family and Social: *** School/Work: *** Self-Care: *** Life Changes: ***  GOALS ADDRESSED: Patient will: 1.  Reduce symptoms of: {IBH Symptoms:21014056}  2.  Increase knowledge and/or ability of: {IBH Patient Tools:21014057}  3.  Demonstrate ability to: {IBH Goals:21014053}  INTERVENTIONS: Interventions utilized:  {IBH Interventions:21014054} Standardized Assessments completed: {IBH Screening Tools:21014051}  ASSESSMENT: Patient currently experiencing ***.   Patient may benefit from ***.  PLAN: 1. Follow up with behavioral health clinician on : *** 2. Behavioral recommendations: *** 3. Referral(s): {IBH Referrals:21014055} 4. "From scale of 1-10, how likely are you to follow plan?": ***  Valetta CloseJamie C Rithik Odea, LCSW

## 2017-02-22 ENCOUNTER — Ambulatory Visit: Payer: Medicaid Other | Admitting: Medical

## 2017-03-21 ENCOUNTER — Ambulatory Visit: Payer: Self-pay | Admitting: Medical

## 2018-03-19 IMAGING — US US MFM FETAL BPP W/O NON-STRESS
1 series · 14 of 28 positions shown · non-contrast
Comparison: none

[Series 1: us mfm fetal bpp w/o non-stress · 40 acquisitions, 14 frames shown]
[im 2/40]
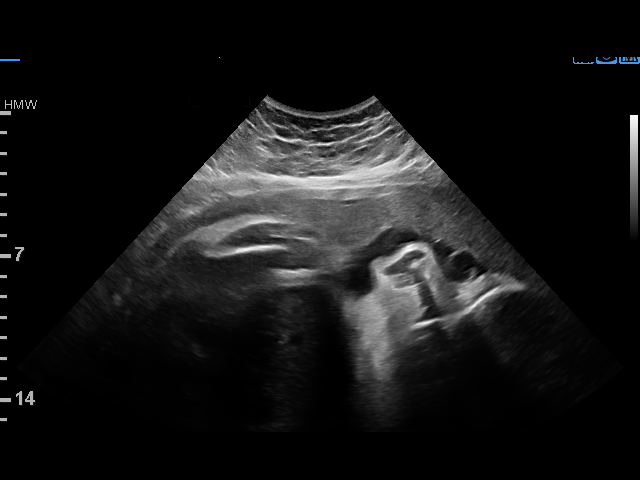
[im 5/40]
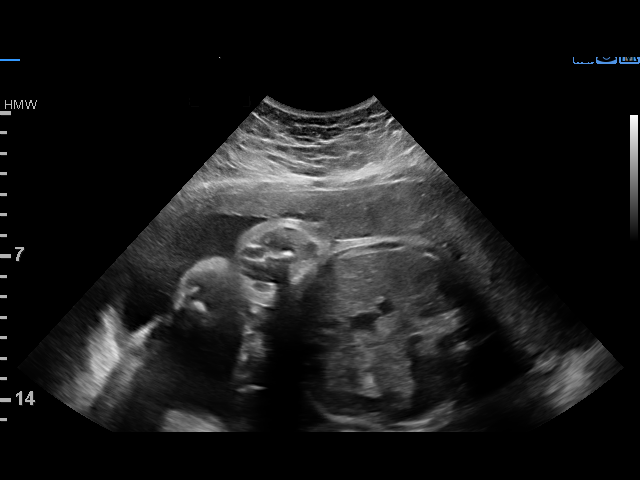
[im 8/40]
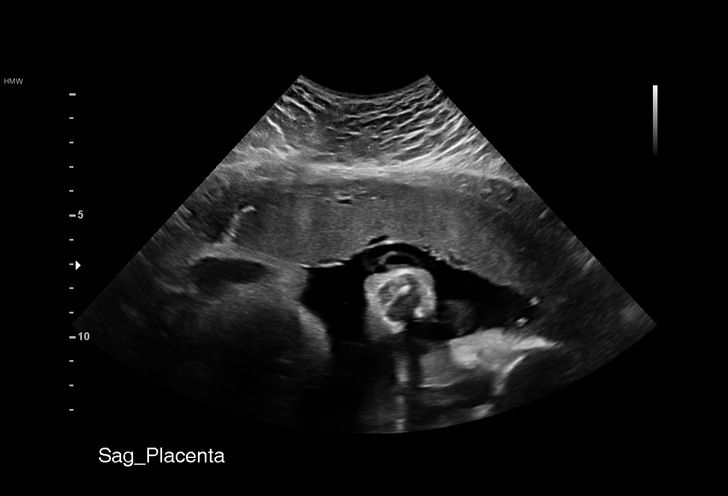
[im 11/40]
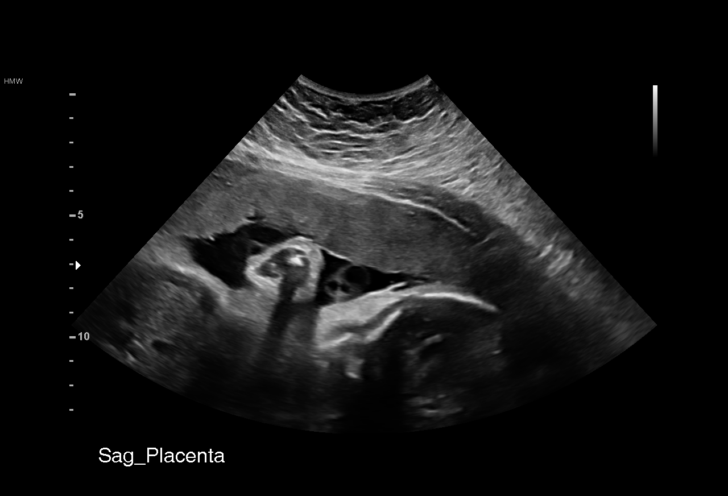
[im 14/40]
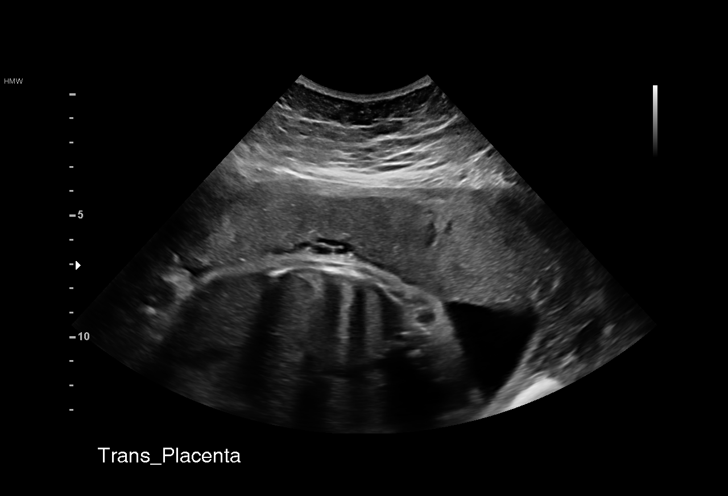
[im 16/40]
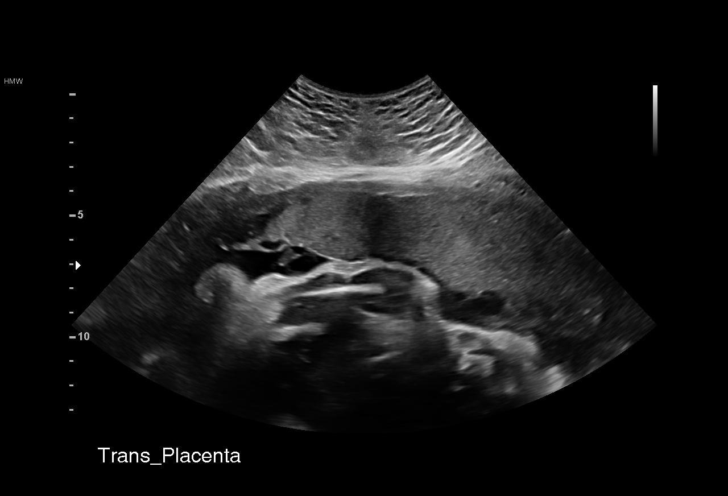
[im 19/40]
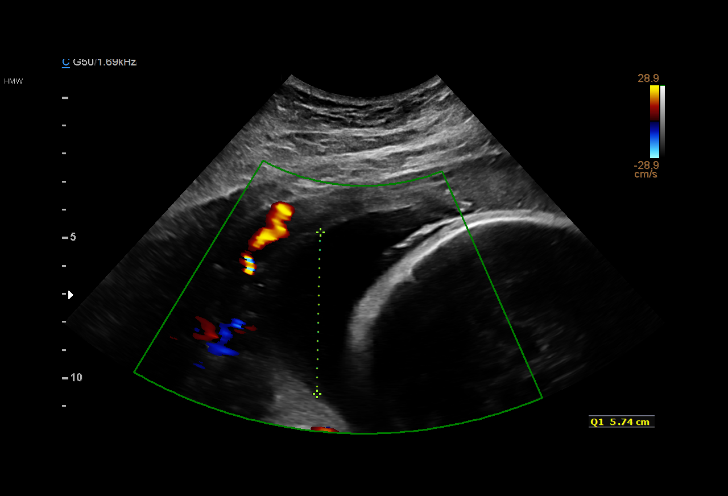
[im 22/40]
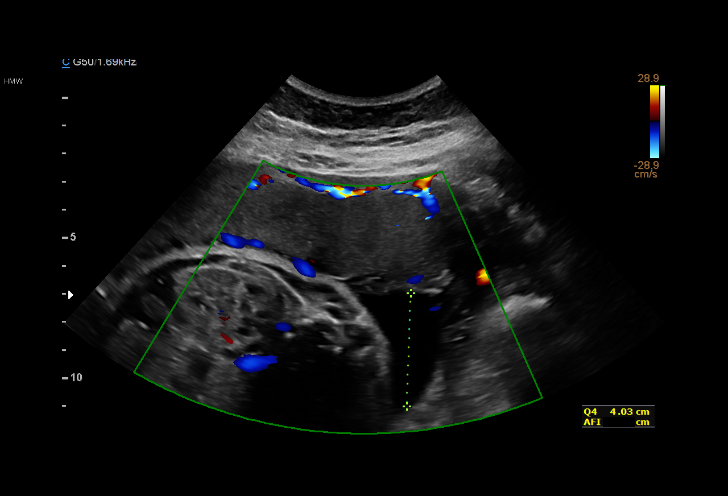
[im 25/40]
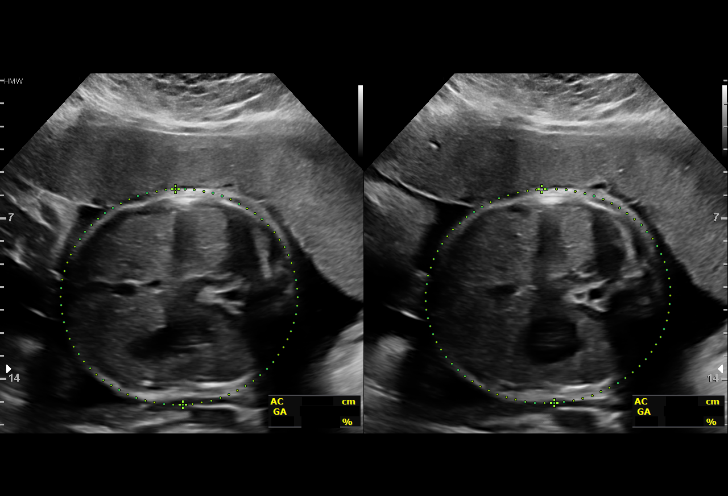
[im 28/40]
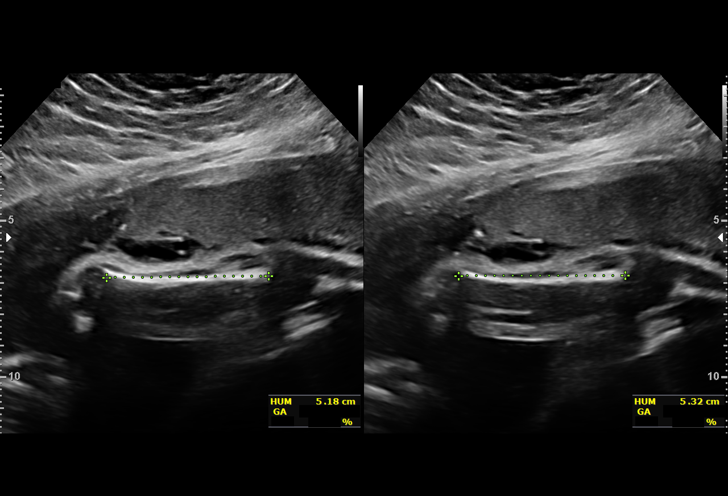
[im 31/40]
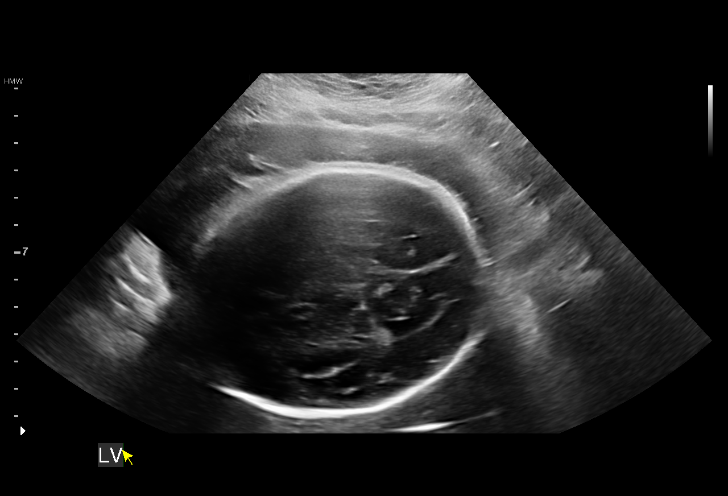
[im 34/40]
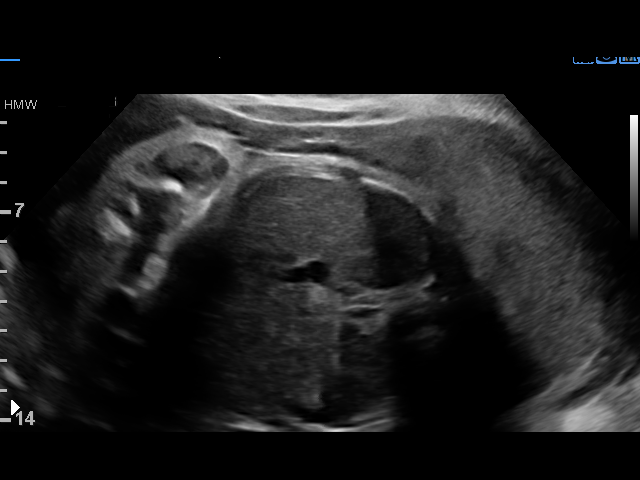
[im 37/40]
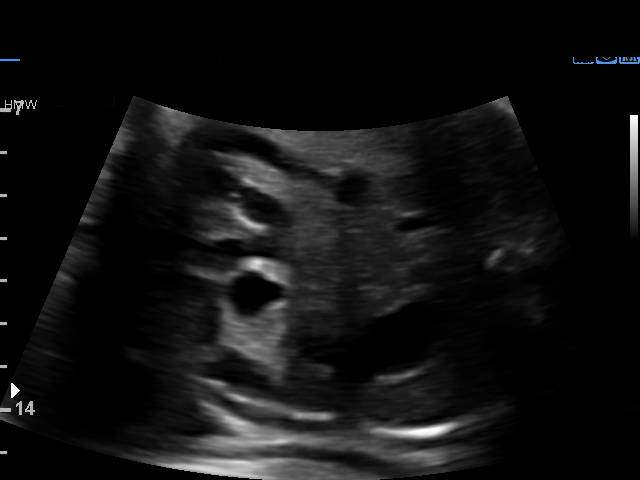
[im 40/40]
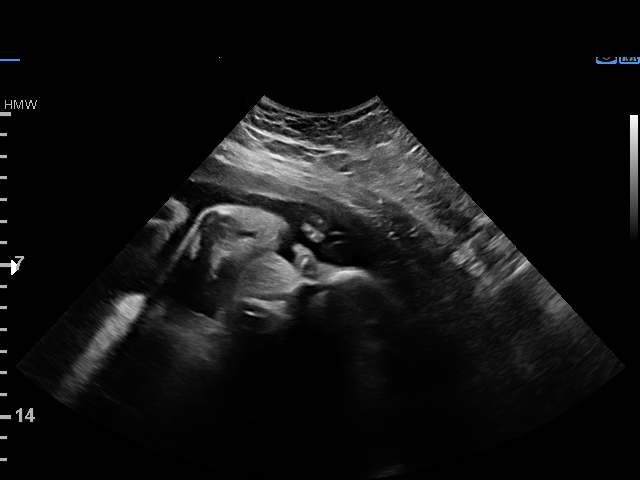

[14 of 28 positions shown; findings below may reference images not displayed]

OB/Gyn Clinic

1  RINNAH TIGER           [id number]      [id number]     [id number]
2  RINNAH TIGER           [id number]      [id number]     [id number]
Indications

32 weeks gestation of pregnancy
Gestational hypertension without significant
proteinuria, unspecified trimester
Poor obstetric history: Previous
preeclampsia / eclampsia/gestational HTN
OB History

Blood Type:            Height:  5'7"   Weight (lb):  218      BMI:
Gravidity:    2         Term:   1
Living:       1
Fetal Evaluation

Num Of Fetuses:     1
Fetal Heart         138
Rate(bpm):
Cardiac Activity:   Observed
Presentation:       Cephalic
Placenta:           Anterior, above cervical os
P. Cord Insertion:  Visualized, central
Amniotic Fluid
AFI FV:      Subjectively within normal limits

AFI Sum(cm)     %Tile       Largest Pocket(cm)
19.16           72

RUQ(cm)       RLQ(cm)       LUQ(cm)        LLQ(cm)
5.74
Biophysical Evaluation

Amniotic F.V:   Within normal limits       F. Tone:        Observed
F. Movement:    Observed                   Score:          [DATE]
F. Breathing:   Observed
Biometry

BPD:      87.5  mm     G. Age:  35w 2d       > 99  %    CI:         77.3   %   70 - 86
FL/HC:      18.4   %   19.1 -
HC:      315.1  mm     G. Age:  35w 2d         92  %    HC/AC:      1.01       0.96 -
AC:      311.7  mm     G. Age:  35w 0d       > 97  %    FL/BPD:     66.4   %   71 - 87
FL:       58.1  mm     G. Age:  30w 2d          7  %    FL/AC:      18.6   %   20 - 24
HUM:      52.5  mm     G. Age:  30w 4d         22  %

Est. FW:    1115  gm      5 lb 1 oz     83  %
Gestational Age

LMP:           32w 0d       Date:   04/27/16                 EDD:   02/01/17
U/S Today:     34w 0d                                        EDD:   01/18/17
Best:          32w 0d    Det. By:   LMP  (04/27/16)          EDD:   02/01/17
Anatomy

Cranium:               Appears normal         Aortic Arch:            Not well visualized
Cavum:                 Previously seen        Ductal Arch:            Previously seen
Ventricles:            Appears normal         Diaphragm:              Previously seen
Choroid Plexus:        Previously seen        Stomach:                Appears normal, left
sided
Cerebellum:            Previously seen        Abdomen:                Previously seen
Posterior Fossa:       Previously seen        Abdominal Wall:         Previously seen
Nuchal Fold:           Previously seen        Cord Vessels:           Previously seen
Face:                  Orbits and profile     Kidneys:                Appear normal
previously seen
Lips:                  Previously seen        Bladder:                Appears normal
Thoracic:              Appears normal         Spine:                  Previously seen
Heart:                 Appears normal         Upper Extremities:      Previously seen
(4CH, axis, and situs
RVOT:                  Appears normal         Lower Extremities:      Previously seen
LVOT:                  Appears normal

Other:  Fetus appears to be a male. Heels and 5th digit previously visualized.
Technically difficult due to maternal habitus and fetal position.
Cervix Uterus Adnexa

Cervix
Not visualized (advanced GA >40wks)
Uterus
No abnormality visualized.

Left Ovary
No adnexal mass visualized.

Right Ovary
No adnexal mass visualized.

Cul De Sac:   No free fluid seen.

Adnexa:       No abnormality visualized.
Impression

Singleton intrauterine pregnancy at 32+0 with GHTN
Interval review of the anatomy shows no sonographic
markers for aneuploidy or structural anomalies
Amniotic fluid volume is normal
Estimated fetal weight is 2297g which is growth in the 83rd
percentile, with AC >97th percentile
BPP [DATE]
Recommendations

Recommend follow-up ultrasound examination in 4 weeks to
reassess rate of fetal growth

## 2018-04-10 IMAGING — US US FETAL BPP W/ NON-STRESS
1 series · 13 of 13 positions shown · non-contrast
Comparison: none

[Series 1: us fetal bpp w/nonstress · 13 acquisitions, 13 frames shown]
[im 1/13]
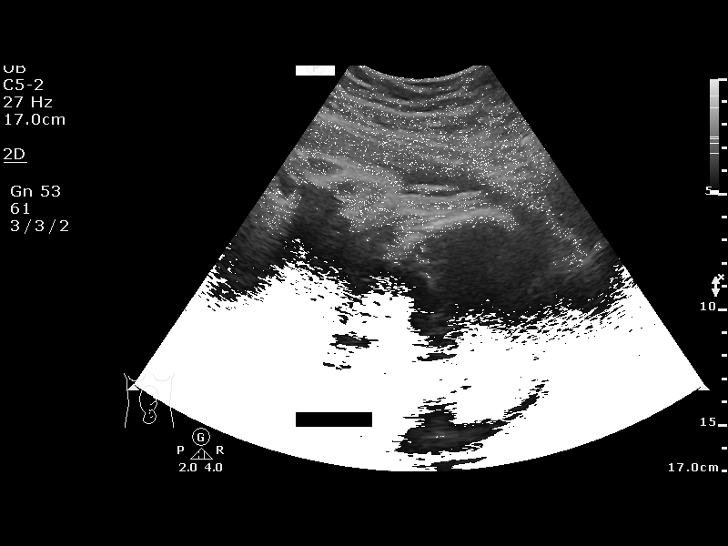
[im 2/13]
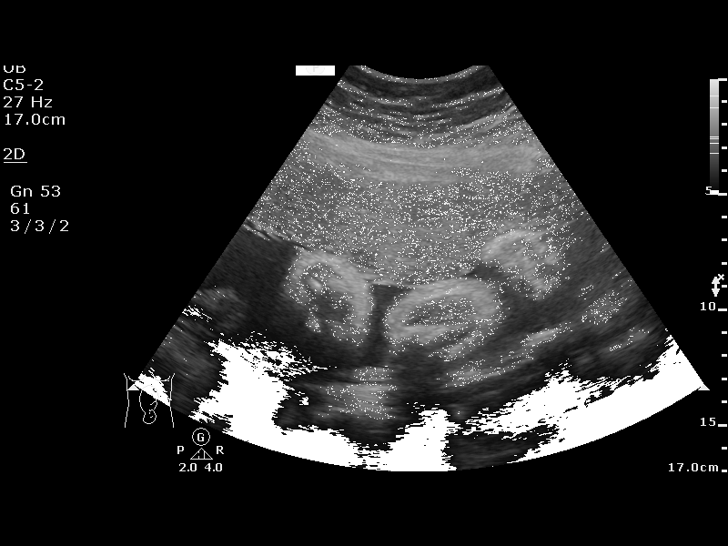
[im 3/13]
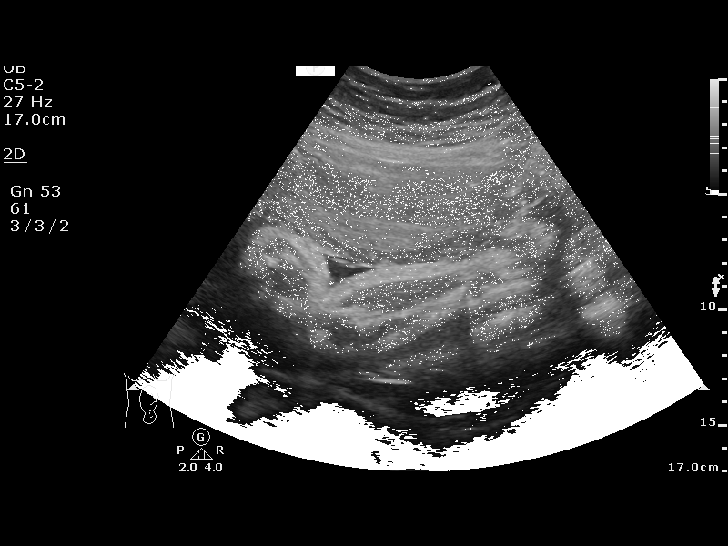
[im 4/13]
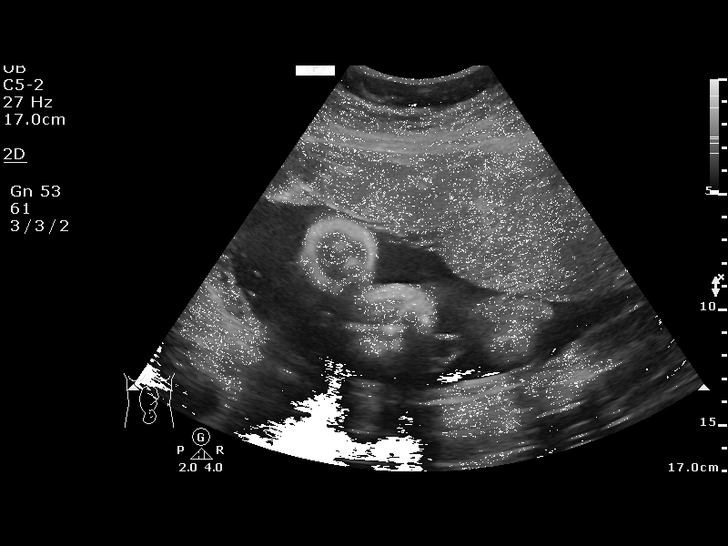
[im 5/13]
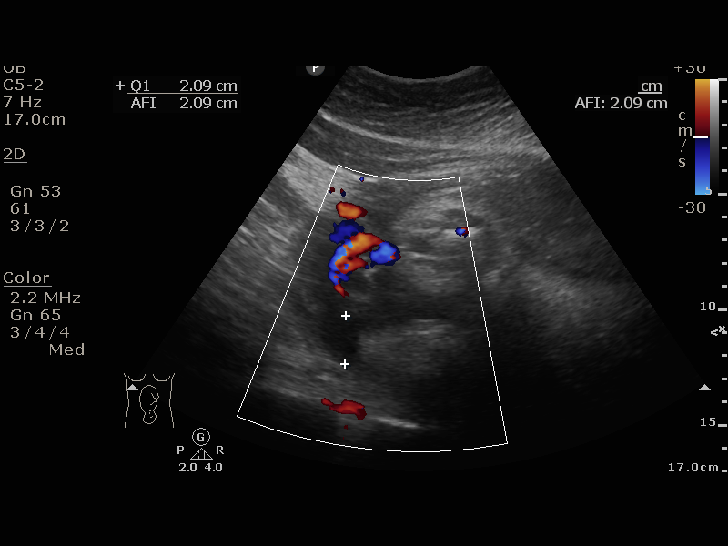
[im 6/13]
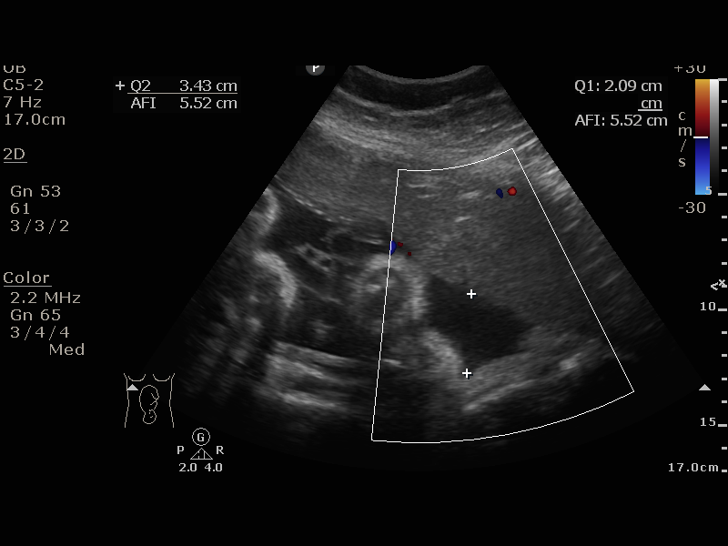
[im 7/13]
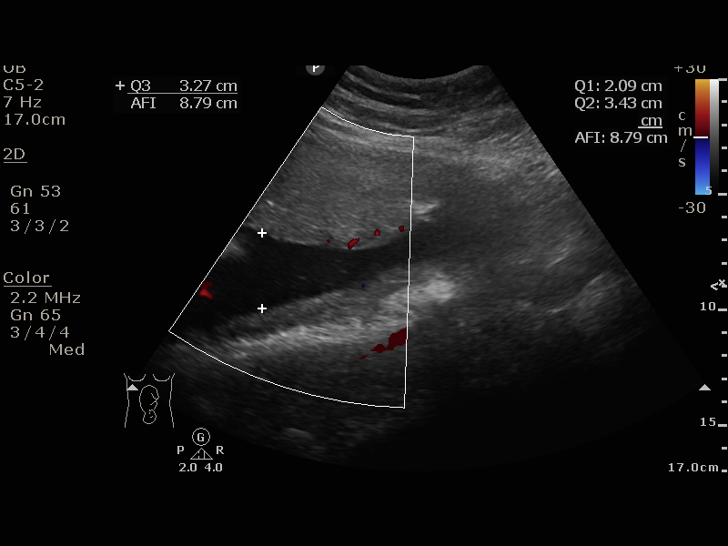
[im 8/13]
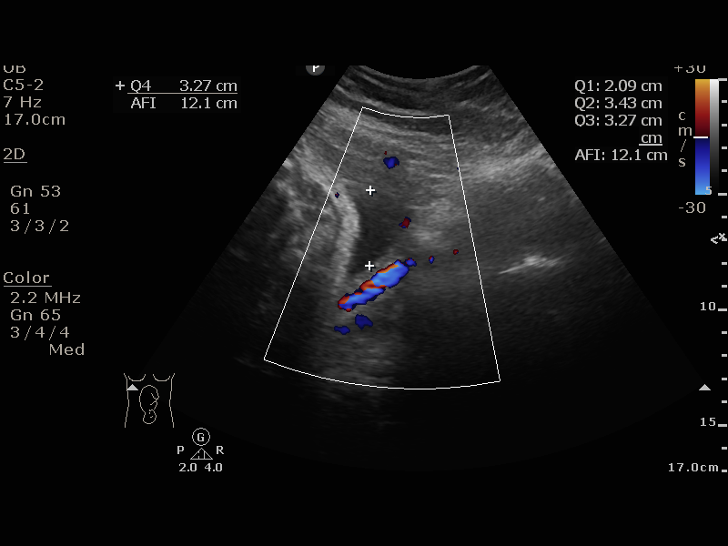
[im 9/13]
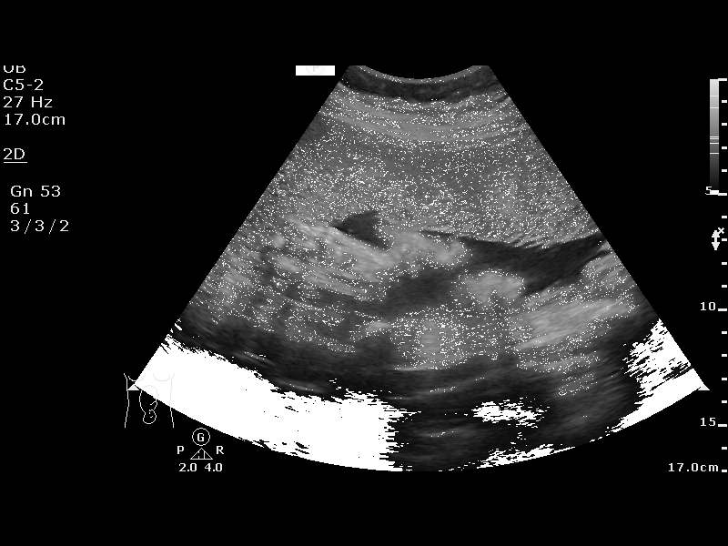
[im 10/13]
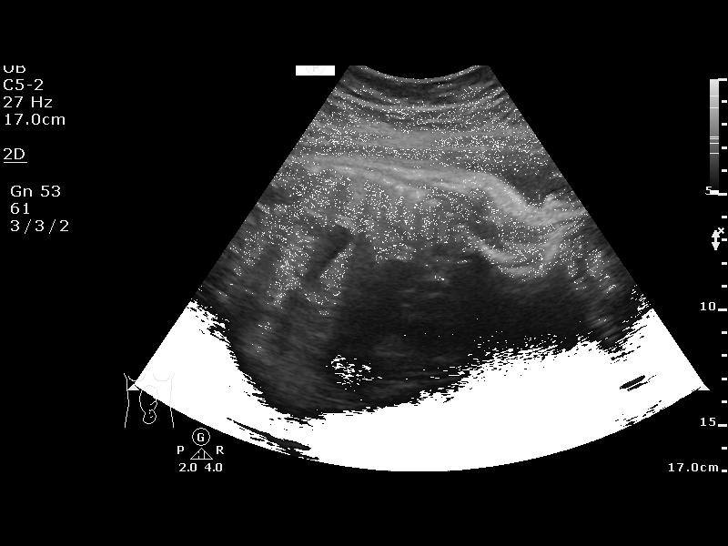
[im 11/13]
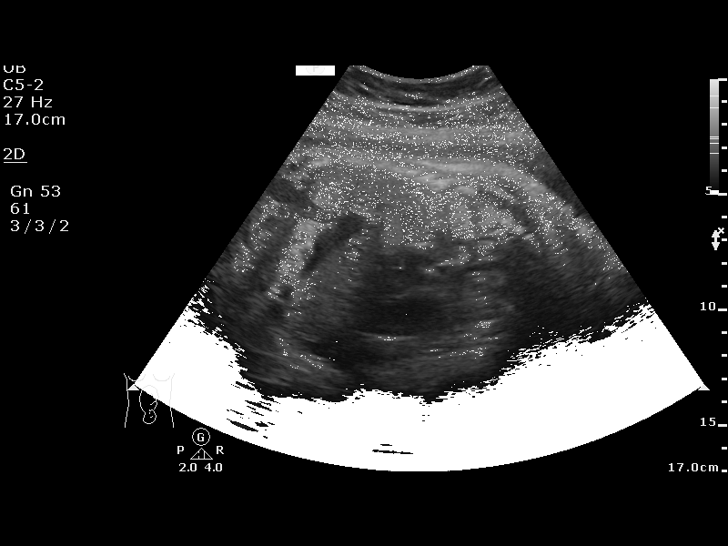
[im 12/13]
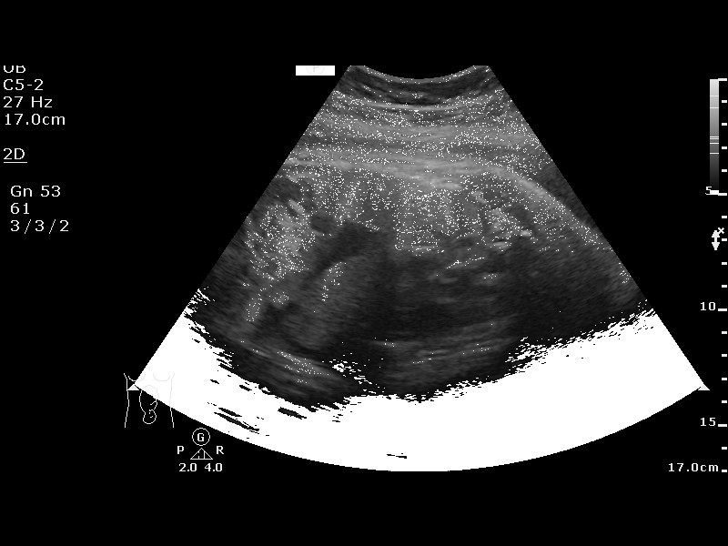
[im 13/13]
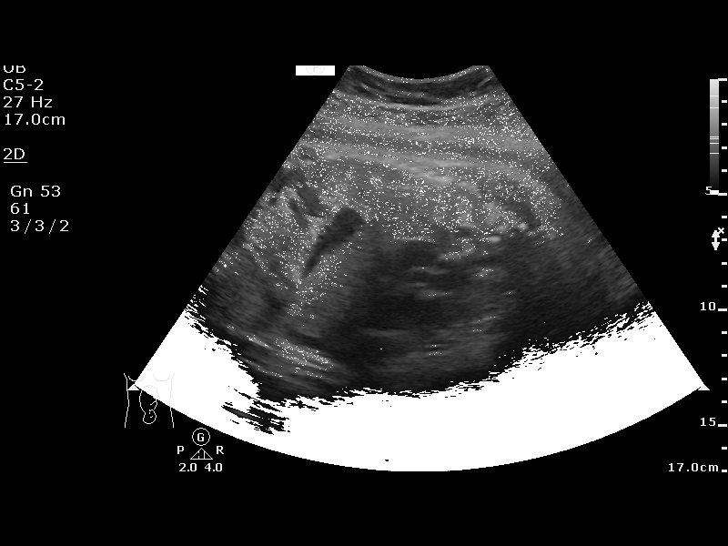

[13 of 13 positions shown; findings below may reference images not displayed]

OB/Gyn Clinic
[REDACTED]

1  US FETAL BPP W/NONSTRESS                    76818.4

1  ALFREDA SAGASTUME             [id number]      [id number]     [id number]
Service(s) Provided

Indications

35 weeks gestation of pregnancy
Gestational hypertension without significant
proteinuria, unspecified trimester
OB History

Blood Type:            Height:  5'7"   Weight (lb):  218       BMI:
Gravidity:    2         Term:   1
Living:       1
Fetal Evaluation

Num Of Fetuses:     1
Preg. Location:     Intrauterine
Cardiac Activity:   Observed
Presentation:       Cephalic
Amniotic Fluid
AFI FV:      Subjectively within normal limits

AFI Sum(cm)     %Tile       Largest Pocket(cm)
12.06           36

RUQ(cm)       RLQ(cm)       LUQ(cm)        LLQ(cm)
2.09
Biophysical Evaluation

Amniotic F.V:   Pocket => 2 cm two         F. Tone:        Observed
planes
F. Movement:    Observed                   N.S.T:          Reactive
F. Breathing:   Observed                   Score:          [DATE]
Gestational Age

LMP:           35w 1d        Date:  04/27/16                 EDD:   02/01/17
Best:          35w 1d     Det. By:  LMP  (04/27/16)          EDD:   02/01/17

## 2018-04-17 IMAGING — US US MFM FETAL BPP W/O NON-STRESS
1 series · 14 of 28 positions shown · non-contrast
Comparison: none

[Series 1: us mfm fetal bpp w/o non-stress · 36 acquisitions, 14 frames shown]
[im 2/36]
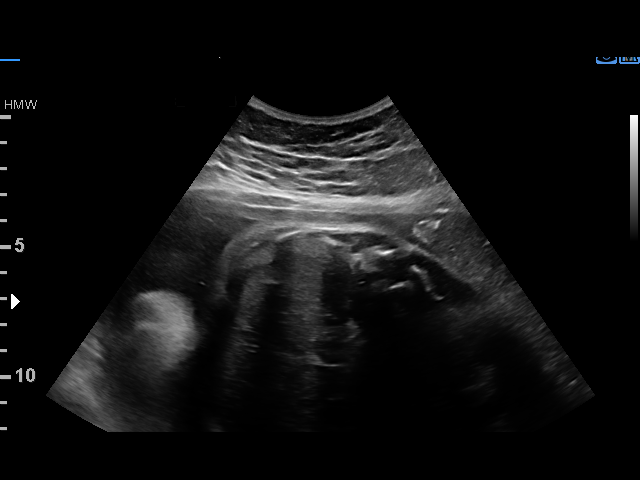
[im 4/36]
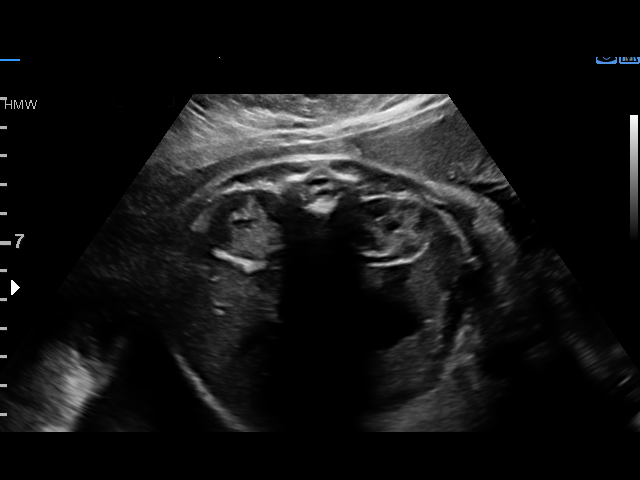
[im 7/36]
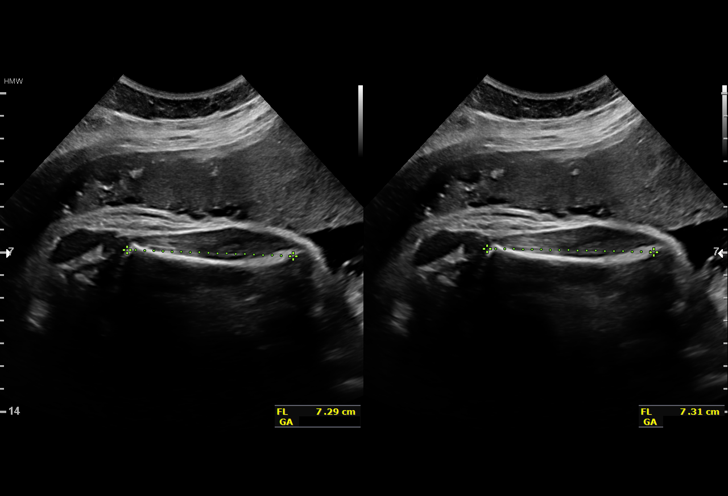
[im 10/36]
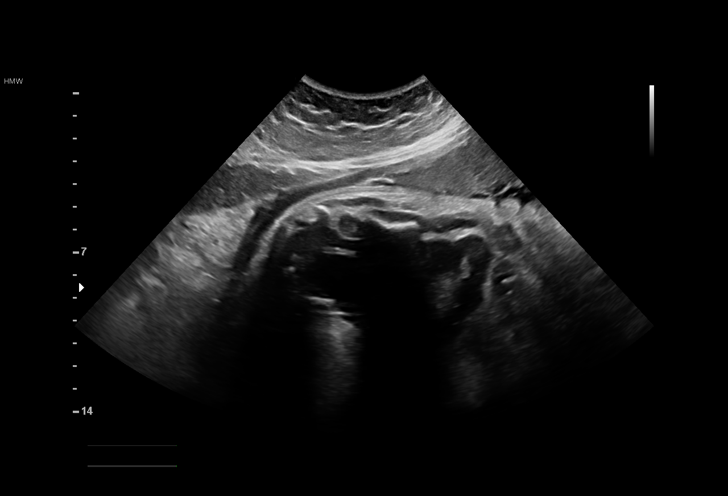
[im 12/36]
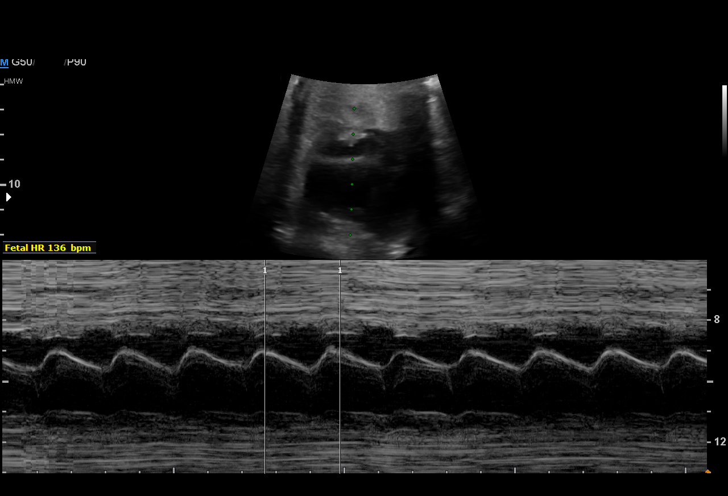
[im 15/36]
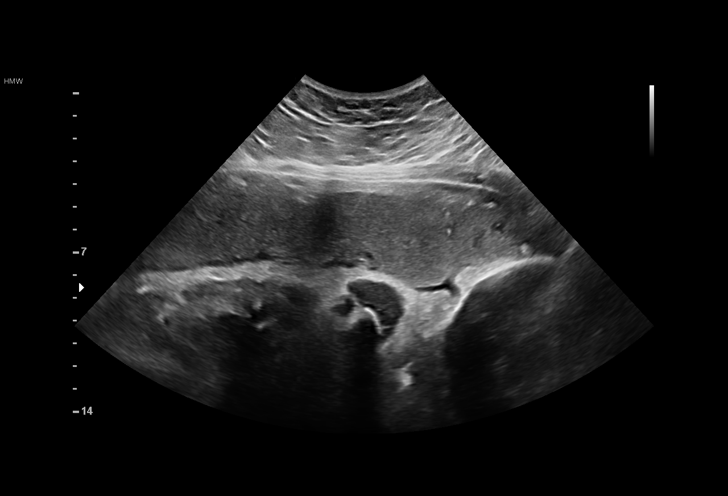
[im 17/36]
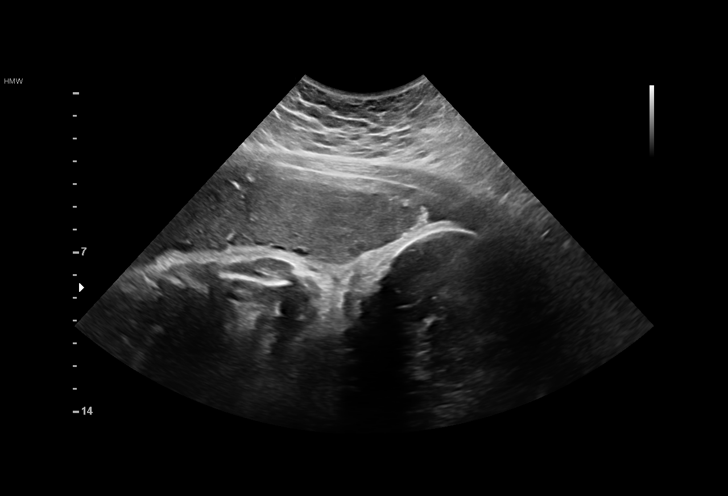
[im 20/36]
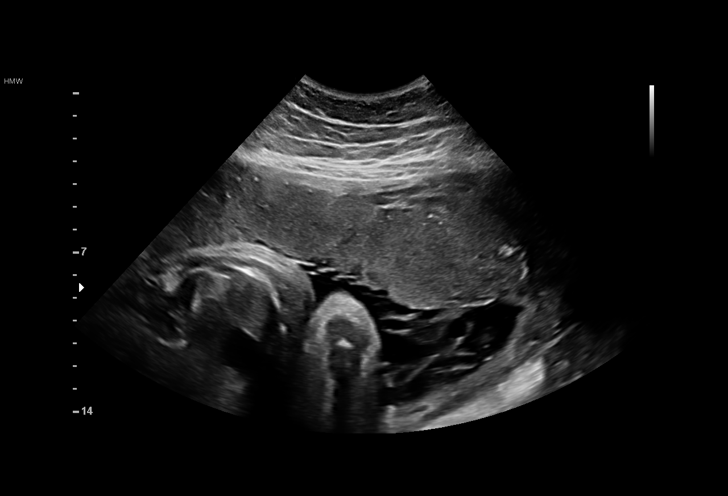
[im 23/36]
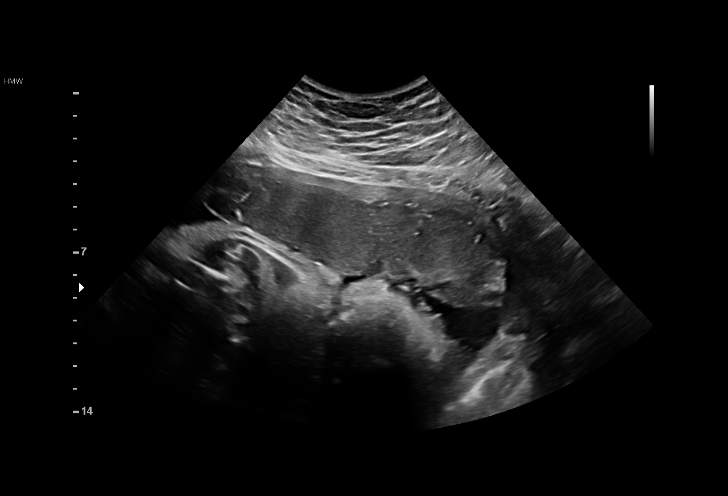
[im 25/36]
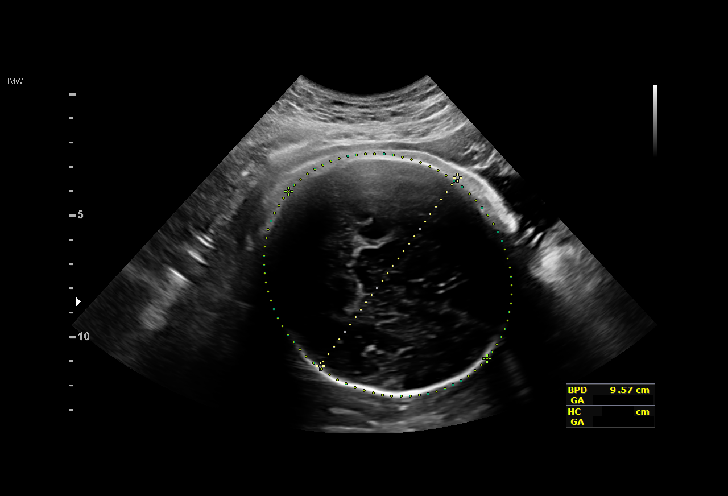
[im 28/36]
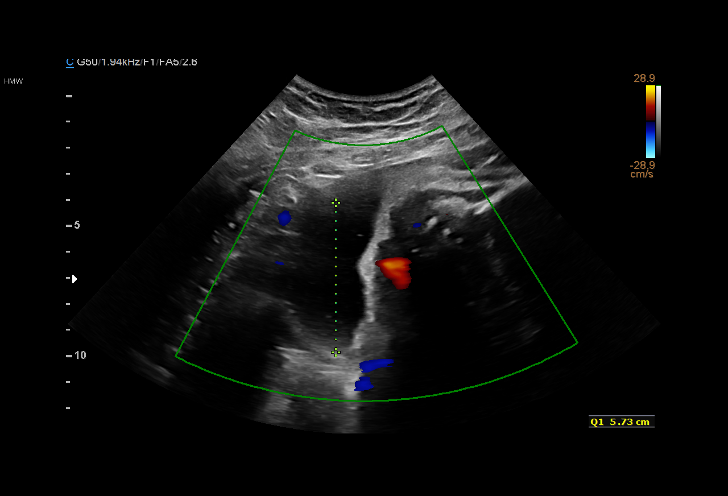
[im 30/36]
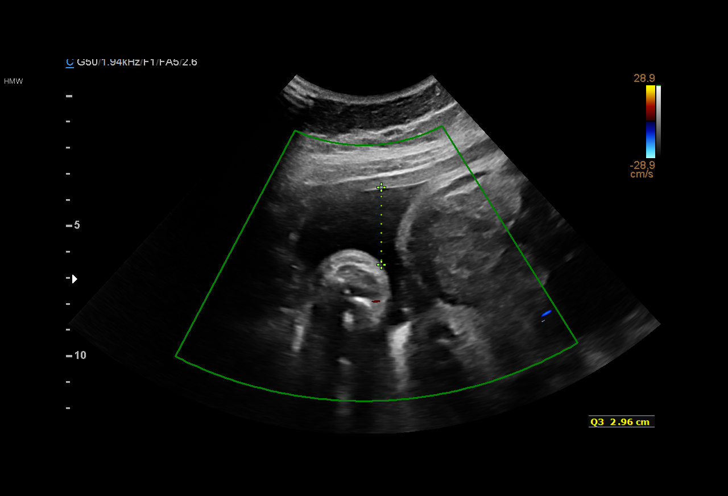
[im 33/36]
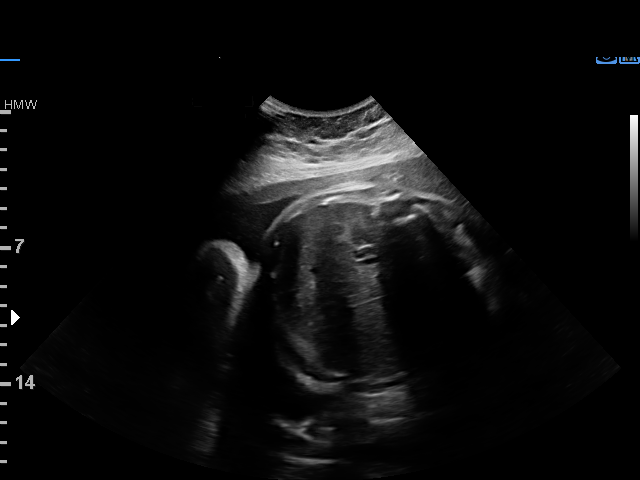
[im 36/36]
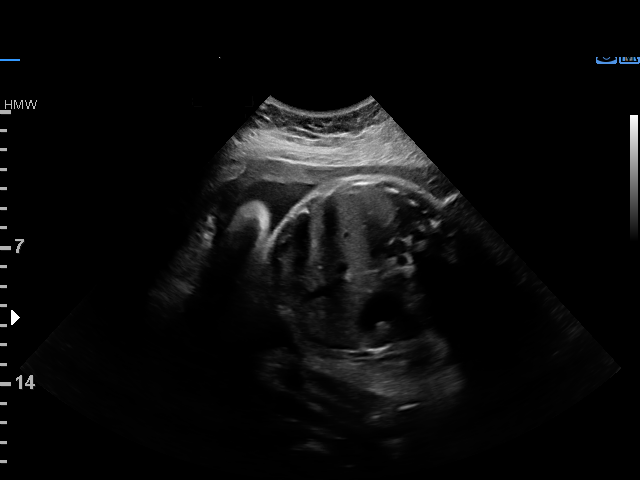

[14 of 28 positions shown; findings below may reference images not displayed]

OB/Gyn Clinic

1  EFRAIM HOMSI              [id number]      [id number]     [id number]
2  POLIN BILLIOT             [id number]      [id number]     [id number]
Indications

36 weeks gestation of pregnancy
Gestational hypertension without significant
proteinuria, unspecified trimester
Poor obstetric history: Previous
preeclampsia / eclampsia/gestational HTN
OB History

Blood Type:            Height:  5'7"   Weight (lb):  218       BMI:
Gravidity:    2         Term:   1
Living:       1
Fetal Evaluation

Num Of Fetuses:     1
Fetal Heart         136
Rate(bpm):
Cardiac Activity:   Observed
Presentation:       Cephalic
Placenta:           Anterior, above cervical os
P. Cord Insertion:  Visualized, central
Amniotic Fluid
AFI FV:      Subjectively within normal limits

AFI Sum(cm)     %Tile       Largest Pocket(cm)
16.39           61

RUQ(cm)       RLQ(cm)       LUQ(cm)        LLQ(cm)
5.73
Biophysical Evaluation

Amniotic F.V:   Within normal limits       F. Tone:        Observed
F. Movement:    Observed                   Score:          [DATE]
F. Breathing:   Observed
Biometry

BPD:      94.8  mm     G. Age:  38w 4d         98  %    CI:        88.09   %    70 - 86
FL/HC:      23.0   %    20.1 -
HC:      317.8  mm     G. Age:  35w 5d         15  %    HC/AC:      0.94        0.93 -
AC:      339.8  mm     G. Age:  37w 6d         94  %    FL/BPD:     77.0   %    71 - 87
FL:         73  mm     G. Age:  37w 3d         76  %    FL/AC:      21.5   %    20 - 24
HUM:      55.7  mm     G. Age:  32w 3d        < 5  %

Est. FW:    5355  gm      7 lb 3 oz     87  %
Gestational Age

LMP:           36w 1d        Date:  04/27/16                 EDD:   02/01/17
U/S Today:     37w 3d                                        EDD:   01/23/17
Best:          36w 1d     Det. By:  LMP  (04/27/16)          EDD:   02/01/17
Anatomy

Cranium:               Appears normal         Aortic Arch:            Not well visualized
Cavum:                 Previously seen        Ductal Arch:            Previously seen
Ventricles:            Appears normal         Diaphragm:              Previously seen
Choroid Plexus:        Previously seen        Stomach:                Appears normal, left
sided
Cerebellum:            Previously seen        Abdomen:                Previously seen
Posterior Fossa:       Previously seen        Abdominal Wall:         Previously seen
Nuchal Fold:           Previously seen        Cord Vessels:           Previously seen
Face:                  Orbits and profile     Kidneys:                Appear normal
previously seen
Lips:                  Previously seen        Bladder:                Appears normal
Thoracic:              Appears normal         Spine:                  Previously seen
Heart:                 Previously seen        Upper Extremities:      Previously seen
RVOT:                  Previously seen        Lower Extremities:      Previously seen
LVOT:                  Previously seen

Other:  Fetus appears to be a male. Heels and 5th digit previously visualized.
Technically difficult due to maternal habitus and fetal position.
Cervix Uterus Adnexa

Cervix
Not visualized (advanced GA >74wks)

Uterus
No abnormality visualized.
Left Ovary
No adnexal mass visualized.

Right Ovary
No adnexal mass visualized.

Cul De Sac:   No free fluid seen.

Adnexa:       No abnormality visualized.
Impression

Single living intrauterine pregnancy at 36w 1d.
Cephalic presentation.
Placenta Anterior, above cervical os.
Normal amniotic fluid volume.
Appropriate interval fetal growth.
Normal interval fetal anatomy.
BPP [DATE].
Recommendations

Delivery scheduled for 37 weeks.

## 2019-03-01 ENCOUNTER — Emergency Department: Payer: Medicaid Other

## 2019-03-01 ENCOUNTER — Encounter: Payer: Self-pay | Admitting: Emergency Medicine

## 2019-03-01 ENCOUNTER — Emergency Department
Admission: EM | Admit: 2019-03-01 | Discharge: 2019-03-01 | Disposition: A | Discharge status: Home / Self Care | Payer: Medicaid Other | Attending: Emergency Medicine | Admitting: Emergency Medicine

## 2019-03-01 ENCOUNTER — Other Ambulatory Visit: Payer: Self-pay

## 2019-03-01 DIAGNOSIS — Z96621 Presence of right artificial elbow joint: Secondary | ICD-10-CM | POA: Insufficient documentation

## 2019-03-01 DIAGNOSIS — O209 Hemorrhage in early pregnancy, unspecified: Secondary | ICD-10-CM | POA: Diagnosis present

## 2019-03-01 DIAGNOSIS — O99511 Diseases of the respiratory system complicating pregnancy, first trimester: Secondary | ICD-10-CM | POA: Insufficient documentation

## 2019-03-01 DIAGNOSIS — J45909 Unspecified asthma, uncomplicated: Secondary | ICD-10-CM | POA: Diagnosis not present

## 2019-03-01 DIAGNOSIS — O039 Complete or unspecified spontaneous abortion without complication: Secondary | ICD-10-CM | POA: Diagnosis not present

## 2019-03-01 DIAGNOSIS — Z3A09 9 weeks gestation of pregnancy: Secondary | ICD-10-CM | POA: Diagnosis not present

## 2019-03-01 DIAGNOSIS — Z79899 Other long term (current) drug therapy: Secondary | ICD-10-CM | POA: Diagnosis not present

## 2019-03-01 LAB — COMPREHENSIVE METABOLIC PANEL
ALT: 11 U/L (ref 0–44)
AST: 17 U/L (ref 15–41)
Albumin: 3.6 g/dL (ref 3.5–5.0)
Alkaline Phosphatase: 66 U/L (ref 38–126)
Anion gap: 7 (ref 5–15)
BUN: 11 mg/dL (ref 6–20)
CO2: 24 mmol/L (ref 22–32)
Calcium: 8.7 mg/dL — ABNORMAL LOW (ref 8.9–10.3)
Chloride: 108 mmol/L (ref 98–111)
Creatinine, Ser: 0.51 mg/dL (ref 0.44–1.00)
GFR calc Af Amer: >60 mL/min (ref >60)
GFR calc non Af Amer: >60 mL/min (ref >60)
Glucose, Bld: 97 mg/dL (ref 70–99)
Potassium: 3.7 mmol/L (ref 3.5–5.1)
Sodium: 139 mmol/L (ref 135–145)
Total Bilirubin: 0.5 mg/dL (ref 0.3–1.2)
Total Protein: 6.9 g/dL (ref 6.5–8.1)

## 2019-03-01 LAB — ABO/RH: ABO/RH(D): O POS

## 2019-03-01 LAB — POCT PREGNANCY, URINE: Preg Test, Ur: POSITIVE — AB

## 2019-03-01 LAB — CBC WITH DIFFERENTIAL/PLATELET
Abs Immature Granulocytes: 0.01 10*3/uL (ref 0.00–0.07)
Basophils Absolute: 0.1 10*3/uL (ref 0.0–0.1)
Basophils Relative: 1 %
Eosinophils Absolute: 0.1 10*3/uL (ref 0.0–0.5)
Eosinophils Relative: 1 %
HCT: 37.2 % (ref 36.0–46.0)
Hemoglobin: 11.9 g/dL — ABNORMAL LOW (ref 12.0–15.0)
Immature Granulocytes: 0 %
Lymphocytes Relative: 45 %
Lymphs Abs: 4 10*3/uL (ref 0.7–4.0)
MCH: 27.8 pg (ref 26.0–34.0)
MCHC: 32 g/dL (ref 30.0–36.0)
MCV: 86.9 fL (ref 80.0–100.0)
Monocytes Absolute: 0.7 10*3/uL (ref 0.1–1.0)
Monocytes Relative: 7 %
Neutro Abs: 4.1 10*3/uL (ref 1.7–7.7)
Neutrophils Relative %: 46 %
Platelets: 338 10*3/uL (ref 150–400)
RBC: 4.28 MIL/uL (ref 3.87–5.11)
RDW: 13.3 % (ref 11.5–15.5)
WBC: 9 10*3/uL (ref 4.0–10.5)
nRBC: 0 % (ref 0.0–0.2)

## 2019-03-01 LAB — HCG, QUANTITATIVE, PREGNANCY: hCG, Beta Chain, Quant, S: 23089 m[IU]/mL — ABNORMAL HIGH (ref <5)

## 2019-03-01 MED ORDER — OXYCODONE-ACETAMINOPHEN 5-325 MG PO TABS: 1.0000 | ORAL_TABLET | ORAL | 0 refills | Status: DC | PRN

## 2019-03-01 MED ORDER — MORPHINE SULFATE (PF) 2 MG/ML IV SOLN
2.0000 mg | Freq: Once | INTRAVENOUS | Status: AC
Start: 2019-03-01 — End: 2019-03-01
  Administered 2019-03-01: 2 mg via INTRAVENOUS
  Filled 2019-03-01: qty 1

## 2019-03-01 NOTE — ED Notes (Signed)
Pt reports spot like bleeding that started yesterday that lasted to 3am this morning. Pt st she woke up and attempted to urinate and felt a large amount of fluid pass into the toilet. Pt unable to visualize a mass due to the large passing of blood at that time. Pt st "since then I've been bleeding, like I 've had to change my pad 4 times since coming here cause its been full of blood"

## 2019-03-01 NOTE — ED Notes (Signed)
EDP preforming pelvic with this RN present as witness. 1 large clot like mass removed

## 2019-03-01 NOTE — ED Notes (Signed)
Pt verbalized understanding of discharge instructions. NAD at this time. 

## 2019-03-01 NOTE — ED Triage Notes (Signed)
Pt to the er for vaginal bleeding and possible miscarriage. Pt believes she is 9 or 10 weeks.

## 2019-03-01 NOTE — ED Provider Notes (Signed)
[facility] Emergency Department Provider Note _________________________   First MD Initiated Contact with Patient 03/01/19 [phone]     (approximate)  I have reviewed the triage vital signs and the nursing notes.   HISTORY  Chief Complaint Vaginal Bleeding   HPI Breanna Carrillo is a 23 y.o. female G3, P2 approximately [redacted] weeks pregnant presents to the emergency department secondary to heavy vaginal bleeding with large clots tonight.  Patient states that started this spotting yesterday but progressed to heavy bleeding with clots today.  Patient states that current pain score 7 out of 10 described as cramping in nature.  Patient has not had an ultrasound performed thus far this pregnancy and [facility]        Past Medical History:  Diagnosis Date  . Asthma   . Pregnancy induced hypertension     Patient Active Problem List   Diagnosis Date Noted  . Gestational hypertension, antepartum 11/22/2016  . Supervision of high risk pregnancy, antepartum 07/27/2016  . Hypercholesterolemia 05/04/2016  . Obesity (BMI 35.0-39.9 without comorbidity) 05/04/2016    Past Surgical History:  Procedure Laterality Date  . JOINT REPLACEMENT     R elbow    Prior to Admission medications   Medication Sig Start Date End Date Taking? Authorizing Provider  ibuprofen (ADVIL,MOTRIN) 600 MG tablet Take 1 tablet (600 mg total) by mouth every 6 (six) hours. 01/14/17   Minott, Lynnae PrudeKeriann S, MD  Prenatal Vit-Fe Fumarate-FA (PRENATAL MULTIVITAMIN) TABS tablet Take 1 tablet by mouth daily at 12 noon.    [provider]  senna-docusate (SENOKOT-S) 8.6-50 MG tablet Take 2 tablets by mouth daily. 01/15/17   Minott, Lynnae PrudeKeriann S, MD  sertraline (ZOLOFT) 25 MG tablet Take 1 tablet (25 mg total) daily by mouth. 01/30/17   Willodean RosenthalHarraway-Smith, Carolyn, MD  sertraline (ZOLOFT) 50 MG tablet Take 1 tablet (50 mg total) daily by mouth. 01/30/17   Willodean RosenthalHarraway-Smith,  Carolyn, MD    Allergies Patient has no known allergies.  Family History  Problem Relation Age of Onset  . Hypertension Father   . Diabetes Father   . Diabetes Maternal Grandmother   . Hypertension Maternal Grandmother   . Hypertension Paternal Grandmother     Social History Social History   Tobacco Use  . Smoking status: Never Smoker  . Smokeless tobacco: Never Used  Substance Use Topics  . Alcohol use: No  . Drug use: No    Review of Systems Constitutional: No fever/chills Eyes: No visual changes. ENT: No sore throat. Cardiovascular: Denies chest pain. Respiratory: Denies shortness of breath. Gastrointestinal: No abdominal pain.  No nausea, no vomiting.  No diarrhea.  No constipation. Genitourinary: Positive for vaginal bleeding and pelvic cramping Musculoskeletal: Negative for neck pain.  Negative for back pain. Integumentary: Negative for rash. Neurological: Negative for headaches, focal weakness or numbness.   ____________________________________________   PHYSICAL EXAM:  VITAL SIGNS: ED Triage Vitals  Enc Vitals Group     BP 03/01/19 0405 132/77     Pulse Rate 03/01/19 0405 95     Resp 03/01/19 0405 17     Temp 03/01/19 0405 98.7 F (37.1 C)     Temp Source 03/01/19 0405 Oral     SpO2 03/01/19 0405 100 %     Weight 03/01/19 0401 88.5 kg (195 lb)     Height 03/01/19 0401 1.702 m (5\' 7" )     Head Circumference --      Peak Flow --  Pain Score 03/01/19 0401 7     Pain Loc --      Pain Edu? --      Excl. in GC? --     Constitutional: Alert and oriented.  Eyes: Conjunctivae are normal.  Mouth/Throat: Patient is wearing a mask. Neck: No stridor.  No meningeal signs.   Cardiovascular: Normal rate, regular rhythm. Good peripheral circulation. Grossly normal heart sounds. Respiratory: Normal respiratory effort.  No retractions. Gastrointestinal: Soft and nontender. No distention.  Genitourinary: Moderate vaginal bleeding with very large clot  (fist size) which was removed Musculoskeletal: No lower extremity tenderness nor edema. No gross deformities of extremities. Neurologic:  Normal speech and language. No gross focal neurologic deficits are appreciated.  Skin:  Skin is warm, dry and intact. Psychiatric: Mood and affect are normal. Speech and behavior are normal.  ____________________________________________   LABS (all labs ordered are listed, but only abnormal results are displayed)  Labs Reviewed  HCG, QUANTITATIVE, PREGNANCY - Abnormal; Notable for the following components:      Result Value   hCG, Beta Chain, Quant, S 23,089 (*)    All other components within normal limits  CBC WITH DIFFERENTIAL/PLATELET - Abnormal; Notable for the following components:   Hemoglobin 11.9 (*)    All other components within normal limits  COMPREHENSIVE METABOLIC PANEL - Abnormal; Notable for the following components:   Calcium 8.7 (*)    All other components within normal limits  POCT PREGNANCY, URINE - Abnormal; Notable for the following components:   Preg Test, Ur POSITIVE (*)    All other components within normal limits  POC URINE PREG, ED  ABO/RH    RADIOLOGY I, Dix N Venetia Prewitt, personally viewed and evaluated these images (plain radiographs) as part of my medical decision making, as well as reviewing the written report by the radiologist.  ED MD interpretation: No intrauterine pregnancy identified no findings of an ectopic pregnancy no evidence of retained products per radiologist  Official radiology report(s): US OB LESS THAN 14 WEEKS WITH OB TRANSVAGINAL  Result Date: 03/01/2019 CLINICAL DATA:  Pregnant patient in first-trimester pregnancy with vaginal bleeding, passing clots. Gestational age [redacted] weeks 6 days based on last menstrual period. Beta hCG 23, 089. EXAM: OBSTETRIC <14 WK [place] AND TRANSVAGINAL OB US TECHNIQUE: Both transabdominal and transvaginal ultrasound examinations were performed for complete evaluation of the  gestation as well as the maternal uterus, adnexal regions, and pelvic cul-de-sac. Transvaginal technique was performed to assess early pregnancy. COMPARISON:  None. FINDINGS: Intrauterine gestational sac: None Yolk sac:  Not visualized. Embryo:  Not Visualized. Subchorionic hemorrhage:  Not applicable. Maternal uterus/adnexae: Uterus is anteverted measuring 10 x 5.4 x 6.0 cm. No intrauterine gestational sac. Marked endometrial thickening of 2.3 cm. No fluid in the endometrial canal. No endometrial vascularity. Right ovary is normal measuring 3.8 x 2.3 x 1.7 cm. The left ovary is normal measuring 3.0 x 1.5 x 2.3 cm. Ovarian blood flow is noted to both ovaries. No adnexal mass. No pelvic free fluid. IMPRESSION: 1. No intrauterine pregnancy.  No findings of ectopic pregnancy. 2. While this is technically pregnancy of unknown location, given beta HCG and marked endometrial thickening, findings are highly suggestive of failed pregnancy. There is no endometrial hyperemia or blood flow to suggest retained products. Electronically Signed   By: Narda Rutherford M.D.   On: 03/01/2019 05:36      Procedures   ____________________________________________   INITIAL IMPRESSION / MDM / ASSESSMENT AND PLAN / ED COURSE  As  part of my medical decision making, I reviewed the following data within the electronic MEDICAL RECORD NUMBER  23 year old female presented with above-stated history and physical exam concerning for possible spontaneous miscarriage versus ectopic pregnancy.  Ultrasound performed revealed no intrauterine pregnancy no evidence of an ectopic pregnancy in addition no evidence of endometrial hyperemia or blood flow to suggest retained products.  Patient discussed with Dr. Marcelline Mates OB/GYN on-call.  Patient be referred to OB/GYN today.  Patient was given IV morphine 4 mg in the emergency department will be prescribed Percocet for home      ____________________________________________  FINAL CLINICAL  IMPRESSION(S) / ED DIAGNOSES  Final diagnoses:  Vaginal bleeding affecting early pregnancy     MEDICATIONS GIVEN DURING THIS VISIT:  Medications  morphine 2 MG/ML injection 2 mg (2 mg Intravenous Given 03/01/19 1287)     ED Discharge Orders    None      *Please note:  MARQUISHA NIKOLOV was evaluated in Emergency Department on 03/01/2019 for the symptoms described in the history of present illness. She was evaluated in the context of the global COVID-19 pandemic, which necessitated consideration that the patient might be at risk for infection with the SARS-CoV-2 virus that causes COVID-19. Institutional protocols and algorithms that pertain to the evaluation of patients at risk for COVID-19 are in a state of rapid change based on information released by regulatory bodies including the CDC and federal and state organizations. These policies and algorithms were followed during the patient's care in the ED.  Some ED evaluations and interventions may be delayed as a result of limited staffing during the pandemic.*  Note:  This document was prepared using Dragon voice recognition software and may include unintentional dictation errors.   Gregor Hams, MD 03/01/19 [phone]

## 2019-03-01 NOTE — ED Triage Notes (Signed)
Pt presents to ED with heavy vaginal bleeding. Currently 9-[redacted] weeks pregnant. Pt reports only spotting lightly yesterday. Passing clots with lower abd cramping.

## 2019-03-01 NOTE — ED Notes (Signed)
Pt to ultrasound

## 2019-03-20 ENCOUNTER — Ambulatory Visit: Payer: Self-pay

## 2019-03-21 ENCOUNTER — Ambulatory Visit: Payer: Self-pay

## 2019-03-29 ENCOUNTER — Ambulatory Visit: Payer: Medicaid Other

## 2019-04-09 DIAGNOSIS — N898 Other specified noninflammatory disorders of vagina: Secondary | ICD-10-CM | POA: Diagnosis not present

## 2019-04-09 DIAGNOSIS — Z202 Contact with and (suspected) exposure to infections with a predominantly sexual mode of transmission: Secondary | ICD-10-CM | POA: Diagnosis not present

## 2020-01-22 ENCOUNTER — Emergency Department
Admission: EM | Admit: 2020-01-22 | Discharge: 2020-01-22 | Disposition: A | Discharge status: Home / Self Care | Payer: Medicaid Other | Attending: Emergency Medicine | Admitting: Emergency Medicine

## 2020-01-22 ENCOUNTER — Other Ambulatory Visit: Payer: Self-pay

## 2020-01-22 DIAGNOSIS — Z96621 Presence of right artificial elbow joint: Secondary | ICD-10-CM | POA: Insufficient documentation

## 2020-01-22 DIAGNOSIS — M542 Cervicalgia: Secondary | ICD-10-CM | POA: Diagnosis not present

## 2020-01-22 DIAGNOSIS — M546 Pain in thoracic spine: Secondary | ICD-10-CM | POA: Insufficient documentation

## 2020-01-22 DIAGNOSIS — M7918 Myalgia, other site: Secondary | ICD-10-CM

## 2020-01-22 DIAGNOSIS — J45909 Unspecified asthma, uncomplicated: Secondary | ICD-10-CM | POA: Diagnosis not present

## 2020-01-22 MED ORDER — IBUPROFEN 800 MG PO TABS: 800.0000 mg | ORAL_TABLET | Freq: Three times a day (TID) | ORAL | 0 refills | Status: AC | PRN

## 2020-01-22 MED ORDER — CYCLOBENZAPRINE HCL 10 MG PO TABS
5.0000 mg | ORAL_TABLET | Freq: Once | ORAL | Status: DC
  Filled 2020-01-22: qty 1

## 2020-01-22 MED ORDER — CYCLOBENZAPRINE HCL 5 MG PO TABS: 5.0000 mg | ORAL_TABLET | Freq: Three times a day (TID) | ORAL | 0 refills | Status: DC | PRN
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
## 2020-03-04 ENCOUNTER — Emergency Department
Admission: EM | Admit: 2020-03-04 | Discharge: 2020-03-04 | Disposition: A | Discharge status: Home / Self Care | Payer: Medicaid Other | Attending: Emergency Medicine | Admitting: Emergency Medicine

## 2020-03-04 ENCOUNTER — Other Ambulatory Visit: Payer: Self-pay

## 2020-03-04 ENCOUNTER — Encounter: Payer: Self-pay | Admitting: Emergency Medicine

## 2020-03-04 ENCOUNTER — Emergency Department: Payer: Medicaid Other

## 2020-03-04 DIAGNOSIS — J101 Influenza due to other identified influenza virus with other respiratory manifestations: Secondary | ICD-10-CM | POA: Diagnosis not present

## 2020-03-04 DIAGNOSIS — R110 Nausea: Secondary | ICD-10-CM | POA: Diagnosis not present

## 2020-03-04 DIAGNOSIS — J45909 Unspecified asthma, uncomplicated: Secondary | ICD-10-CM | POA: Insufficient documentation

## 2020-03-04 DIAGNOSIS — R059 Cough, unspecified: Secondary | ICD-10-CM | POA: Diagnosis not present

## 2020-03-04 DIAGNOSIS — Z96621 Presence of right artificial elbow joint: Secondary | ICD-10-CM | POA: Diagnosis not present

## 2020-03-04 DIAGNOSIS — J111 Influenza due to unidentified influenza virus with other respiratory manifestations: Secondary | ICD-10-CM | POA: Diagnosis not present

## 2020-03-04 DIAGNOSIS — Z20822 Contact with and (suspected) exposure to covid-19: Secondary | ICD-10-CM | POA: Insufficient documentation

## 2020-03-04 DIAGNOSIS — R519 Headache, unspecified: Secondary | ICD-10-CM | POA: Diagnosis not present

## 2020-03-04 LAB — RESP PANEL BY RT-PCR (FLU A&B, COVID) ARPGX2
Influenza A by PCR: POSITIVE — AB
Influenza B by PCR: NEGATIVE
SARS Coronavirus 2 by RT PCR: NEGATIVE

## 2020-03-04 MED ORDER — OSELTAMIVIR PHOSPHATE 75 MG PO CAPS: 75.0000 mg | ORAL_CAPSULE | Freq: Two times a day (BID) | ORAL | 0 refills | Status: AC

## 2020-03-04 MED ORDER — GUAIFENESIN-CODEINE 100-10 MG/5ML PO SOLN: 5.0000 mL | Freq: Four times a day (QID) | ORAL | 0 refills | Status: DC | PRN

## 2020-03-04 NOTE — ED Provider Notes (Addendum)
[facility] Emergency Department Provider Note  ____________________________________________   Event Date/Time   First MD Initiated Contact with Patient 03/04/20 [phone]     (approximate)  I have reviewed the triage vital signs and the nursing notes.   HISTORY  Chief Complaint Cough and Nausea   HPI Breanna Carrillo is a 24 y.o. female presents to the ED with complaint of sudden onset of generalized body aches, headache, cough and nausea without vomiting.  Patient states that this began less than 2 days ago on Monday night.  Patient is a non-smoker and states that there is no history of bronchitis or pneumonia.  Currently she rates her pain as an 8 out of 10.       Past Medical History:  Diagnosis Date   Asthma    Pregnancy induced hypertension     Patient Active Problem List   Diagnosis Date Noted   Gestational hypertension, antepartum 11/22/2016   Supervision of high risk pregnancy, antepartum 07/27/2016   Hypercholesterolemia 05/04/2016   Obesity (BMI 35.0-39.9 without comorbidity) 05/04/2016    Past Surgical History:  Procedure Laterality Date   JOINT REPLACEMENT     R elbow    Prior to Admission medications   Medication Sig Start Date End Date Taking? Authorizing Provider  cyclobenzaprine (FLEXERIL) 5 MG tablet Take 1 tablet (5 mg total) by mouth 3 (three) times daily as needed. 01/22/20   Menshew, Charlesetta Ivory, PA-C  guaiFENesin-codeine 100-10 MG/5ML syrup Take 5 mLs by mouth every 6 (six) hours as needed for cough. 03/04/20   Tommi Rumps, PA-C  ibuprofen (ADVIL) 800 MG tablet Take 1 tablet (800 mg total) by mouth every 8 (eight) hours as needed. 01/22/20   Menshew, Charlesetta Ivory, PA-C  oseltamivir (TAMIFLU) 75 MG capsule Take 1 capsule (75 mg total) by mouth 2 (two) times daily for 5 days. 03/04/20 03/09/20  Tommi Rumps, PA-C  Prenatal Vit-Fe Fumarate-FA (PRENATAL MULTIVITAMIN) TABS tablet Take 1 tablet by mouth daily  at 12 noon.    [provider]  senna-docusate (SENOKOT-S) 8.6-50 MG tablet Take 2 tablets by mouth daily. 01/15/17   Minott, Lynnae Prude, MD  sertraline (ZOLOFT) 25 MG tablet Take 1 tablet (25 mg total) daily by mouth. 01/30/17   Willodean Rosenthal, MD  sertraline (ZOLOFT) 50 MG tablet Take 1 tablet (50 mg total) daily by mouth. 01/30/17   Willodean Rosenthal, MD    Allergies Patient has no known allergies.  Family History  Problem Relation Age of Onset   Hypertension Father    Diabetes Father    Migraines Father    Diabetes Maternal Grandmother    Hypertension Maternal Grandmother    Stroke Maternal Grandmother    Heart disease Maternal Grandmother    Hypertension Paternal Grandmother    Cirrhosis Maternal Grandfather    Cancer Paternal Grandfather        bone marrow    Social History Social History   Tobacco Use   Smoking status: Never Smoker   Smokeless tobacco: Never Used  Building services engineer Use: Never used  Substance Use Topics   Alcohol use: No   Drug use: No    Review of Systems Constitutional: Subjective fever/chills Eyes: No visual changes. ENT: No sore throat. Cardiovascular: Denies chest pain. Respiratory: Denies shortness of breath.  Positive for cough. Gastrointestinal: No abdominal pain.  Positive nausea, no vomiting.  No diarrhea.   Genitourinary: Negative for dysuria. Musculoskeletal: Positive body aches. Skin: Negative  for rash. Neurological: Positive for headache, negative for focal weakness or numbness.  ____________________________________________   PHYSICAL EXAM:  VITAL SIGNS: ED Triage Vitals [03/04/20 0504]  Enc Vitals Group     BP (!) 141/93     Pulse Rate (!) 120     Resp 18     Temp 99.2 F (37.3 C)     Temp Source Oral     SpO2 98 %     Weight 190 lb (86.2 kg)     Height 5\' 7"  (1.702 m)     Head Circumference      Peak Flow      Pain Score 8     Pain Loc      Pain Edu?      Excl. in [place]?      Constitutional: Alert and oriented. Well appearing and in no acute distress. Eyes: Conjunctivae are normal. PERRL. EOMI. Head: Atraumatic. Nose: Positive congestion/negative rhinnorhea. Neck: No stridor.   Cardiovascular: Normal rate, regular rhythm. Grossly normal heart sounds.  Good peripheral circulation. Respiratory: Normal respiratory effort.  No retractions. Lungs CTAB. Gastrointestinal: Soft and nontender. No distention.  Musculoskeletal: Moves upper and lower extremities with any difficulty.  Normal gait was noted. Neurologic:  Normal speech and language. No gross focal neurologic deficits are appreciated.  Skin:  Skin is warm, dry and intact. No rash noted. Psychiatric: Mood and affect are normal. Speech and behavior are normal.  ____________________________________________   LABS (all labs ordered are listed, but only abnormal results are displayed)  Labs Reviewed  RESP PANEL BY RT-PCR (FLU A&B, COVID) ARPGX2 - Abnormal; Notable for the following components:      Result Value   Influenza A by PCR POSITIVE (*)    All other components within normal limits   _ RADIOLOGY I, , personally viewed and evaluated these images (plain radiographs) as part of my medical decision making, as well as reviewing the written report by the radiologist.  Official radiology report(s): DG Chest 2 View  Result Date: 03/04/2020 CLINICAL DATA:  Cough with nausea and headache since Monday EXAM: CHEST - 2 VIEW COMPARISON:  06/11/2014 FINDINGS: The heart size and mediastinal contours are within normal limits. Both lungs are clear. The visualized skeletal structures are unremarkable. IMPRESSION: No active cardiopulmonary disease. Electronically Signed   By: 06/13/2014 M.D.   On: 03/04/2020 05:37    ____________________________________________   PROCEDURES  Procedure(s) performed (including Critical  Care):  Procedures   ____________________________________________   INITIAL IMPRESSION / ASSESSMENT AND PLAN / ED COURSE  As part of my medical decision making, I reviewed the following data within the electronic MEDICAL RECORD NUMBER Notes from prior ED visits and [place] Controlled Substance Database  24 year old female presents to the ED with less than 2 days of flulike symptoms that began suddenly.  Chest x-ray was negative for any acute cardiopulmonary changes however her nasal swab was positive for influenza A.  Patient was made aware.  A prescription for Tamiflu and Robitussin-AC was sent to her pharmacy.  Patient is encouraged to drink fluids frequently and continue with ibuprofen or Tylenol as needed for fever, headaches or body aches.  She is return to the emergency department over the holiday weekend if any severe worsening of her symptoms.  ____________________________________________   FINAL CLINICAL IMPRESSION(S) / ED DIAGNOSES  Final diagnoses:  Influenza A     ED Discharge Orders         Ordered    oseltamivir (TAMIFLU) 75  MG capsule  2 times daily        03/04/20 0751    guaiFENesin-codeine 100-10 MG/5ML syrup  Every 6 hours PRN        03/04/20 0751          *Please note:  Breanna Carrillo was evaluated in Emergency Department on 03/04/2020 for the symptoms described in the history of present illness. She was evaluated in the context of the global COVID-19 pandemic, which necessitated consideration that the patient might be at risk for infection with the SARS-CoV-2 virus that causes COVID-19. Institutional protocols and algorithms that pertain to the evaluation of patients at risk for COVID-19 are in a state of rapid change based on information released by regulatory bodies including the CDC and federal and state organizations. These policies and algorithms were followed during the patient's care in the ED.  Some ED evaluations and interventions may be delayed as a result of  limited staffing during and the pandemic.*   Note:  This document was prepared using Dragon voice recognition software and may include unintentional dictation errors.   Tommi Rumps, PA-C 03/04/20 0826    Tommi Rumps, PA-C 03/04/20 1016    Dionne Bucy, MD 03/04/20 1331

## 2020-03-04 NOTE — Discharge Instructions (Signed)
Follow up with your doctor if any continued problems.  Tylenol or ibuprofen for fever, aches and headaches if needed.  Take cough medication as long as you do not drive.  Increase fluids.

## 2020-03-04 NOTE — ED Notes (Signed)
Pt verbalizes understanding of d/c instructions ,mediations and follow up

## 2020-03-04 NOTE — ED Triage Notes (Signed)
Patient ambulatory to triage with steady gait, without difficulty or distress noted; pt reports having generalized HA accomp by nausea and cough since Monday

## 2020-03-04 NOTE — ED Notes (Signed)
Pt d/c form printed, signed and sent to HIM to be scanned into the chart 

## 2020-10-21 ENCOUNTER — Ambulatory Visit: Payer: Medicaid Other | Attending: Oncology | Admitting: *Deleted

## 2020-10-21 ENCOUNTER — Encounter: Payer: Self-pay | Admitting: *Deleted

## 2020-10-21 NOTE — Progress Notes (Deleted)
Patient presents to [facility] today for evaluation of a left breast mass.  When patient arrived she presents with full Medicaid benefits and therefore is not eligible for [facility].  Encouraged patient to make an appointment to see her PCP to evaluate her concerns and possibly scheduling an ultrasound.  She is agreeable.   Hand-out given to patient showing active Medicaid.

## 2020-10-21 NOTE — Progress Notes (Signed)
Patient presents to [facility] today for evaluation of a left breast mass.  When patient arrived she presents with full Medicaid benefits and therefore is not eligible for [facility].  Encouraged patient to make an appointment to see her PCP to evaluate her concerns and possibly scheduling an ultrasound.  She is agreeable.   Hand-out given to patient showing active Medicaid.   

## 2020-10-26 ENCOUNTER — Other Ambulatory Visit: Payer: Self-pay | Admitting: Family Medicine

## 2020-10-26 DIAGNOSIS — N6324 Unspecified lump in the left breast, lower inner quadrant: Secondary | ICD-10-CM | POA: Diagnosis not present

## 2020-11-06 ENCOUNTER — Ambulatory Visit
Admission: RE | Admit: 2020-11-06 | Discharge: 2020-11-06 | Disposition: A | Discharge status: Home / Self Care | Payer: Medicaid Other | Source: Ambulatory Visit | Attending: Family Medicine | Admitting: Family Medicine

## 2020-11-06 ENCOUNTER — Other Ambulatory Visit: Payer: Self-pay

## 2020-11-06 DIAGNOSIS — N6324 Unspecified lump in the left breast, lower inner quadrant: Secondary | ICD-10-CM | POA: Diagnosis not present

## 2021-03-30 DIAGNOSIS — L249 Irritant contact dermatitis, unspecified cause: Secondary | ICD-10-CM | POA: Diagnosis not present

## 2021-04-21 DIAGNOSIS — R1110 Vomiting, unspecified: Secondary | ICD-10-CM | POA: Diagnosis not present

## 2021-04-21 DIAGNOSIS — Z8379 Family history of other diseases of the digestive system: Secondary | ICD-10-CM | POA: Diagnosis not present

## 2021-04-21 DIAGNOSIS — R1011 Right upper quadrant pain: Secondary | ICD-10-CM | POA: Diagnosis not present

## 2021-04-23 ENCOUNTER — Encounter: Payer: Self-pay | Admitting: Emergency Medicine

## 2021-04-23 ENCOUNTER — Emergency Department: Payer: Medicaid Other

## 2021-04-23 ENCOUNTER — Emergency Department
Admission: EM | Admit: 2021-04-23 | Discharge: 2021-04-23 | Disposition: A | Discharge status: Home / Self Care | Payer: Medicaid Other | Attending: Emergency Medicine | Admitting: Emergency Medicine

## 2021-04-23 ENCOUNTER — Other Ambulatory Visit: Payer: Self-pay

## 2021-04-23 DIAGNOSIS — K2901 Acute gastritis with bleeding: Secondary | ICD-10-CM | POA: Insufficient documentation

## 2021-04-23 DIAGNOSIS — K2900 Acute gastritis without bleeding: Secondary | ICD-10-CM | POA: Diagnosis not present

## 2021-04-23 DIAGNOSIS — R1011 Right upper quadrant pain: Secondary | ICD-10-CM | POA: Diagnosis present

## 2021-04-23 DIAGNOSIS — R1010 Upper abdominal pain, unspecified: Secondary | ICD-10-CM | POA: Diagnosis not present

## 2021-04-23 LAB — COMPREHENSIVE METABOLIC PANEL
ALT: 12 U/L (ref 0–44)
AST: 15 U/L (ref 15–41)
Albumin: 4.3 g/dL (ref 3.5–5.0)
Alkaline Phosphatase: 72 U/L (ref 38–126)
Anion gap: 8 (ref 5–15)
BUN: 15 mg/dL (ref 6–20)
CO2: 25 mmol/L (ref 22–32)
Calcium: 9.1 mg/dL (ref 8.9–10.3)
Chloride: 102 mmol/L (ref 98–111)
Creatinine, Ser: 0.59 mg/dL (ref 0.44–1.00)
GFR, Estimated: >60 mL/min (ref >60)
Glucose, Bld: 118 mg/dL — ABNORMAL HIGH (ref 70–99)
Potassium: 3.6 mmol/L (ref 3.5–5.1)
Sodium: 135 mmol/L (ref 135–145)
Total Bilirubin: 0.3 mg/dL (ref 0.3–1.2)
Total Protein: 7.9 g/dL (ref 6.5–8.1)

## 2021-04-23 LAB — URINALYSIS, ROUTINE W REFLEX MICROSCOPIC
Bilirubin Urine: NEGATIVE
Glucose, UA: NEGATIVE mg/dL
Ketones, ur: 5 mg/dL — AB
Nitrite: NEGATIVE
Protein, ur: 30 mg/dL — AB
Specific Gravity, Urine: 1.029 (ref 1.005–1.030)
pH: 5 (ref 5.0–8.0)

## 2021-04-23 LAB — CBC
HCT: 43.4 % (ref 36.0–46.0)
Hemoglobin: 14.6 g/dL (ref 12.0–15.0)
MCH: 30.2 pg (ref 26.0–34.0)
MCHC: 33.6 g/dL (ref 30.0–36.0)
MCV: 89.9 fL (ref 80.0–100.0)
Platelets: 365 10*3/uL (ref 150–400)
RBC: 4.83 MIL/uL (ref 3.87–5.11)
RDW: 12.4 % (ref 11.5–15.5)
WBC: 8.8 10*3/uL (ref 4.0–10.5)
nRBC: 0 % (ref 0.0–0.2)

## 2021-04-23 LAB — LIPASE, BLOOD: Lipase: 34 U/L (ref 11–51)

## 2021-04-23 LAB — POC URINE PREG, ED: Preg Test, Ur: NEGATIVE — NL

## 2021-04-23 MED ORDER — MORPHINE SULFATE (PF) 4 MG/ML IV SOLN
4.0000 mg | Freq: Once | INTRAVENOUS | Status: AC
  Administered 2021-04-23: 4 mg via INTRAVENOUS
  Filled 2021-04-23: qty 1

## 2021-04-23 MED ORDER — PANTOPRAZOLE SODIUM 40 MG IV SOLR
40.0000 mg | Freq: Once | INTRAVENOUS | Status: AC
  Administered 2021-04-23: 40 mg via INTRAVENOUS
  Filled 2021-04-23: qty 10

## 2021-04-23 MED ORDER — ONDANSETRON HCL 4 MG/2ML IJ SOLN
4.0000 mg | Freq: Once | INTRAMUSCULAR | Status: AC
  Administered 2021-04-23: 4 mg via INTRAVENOUS
  Filled 2021-04-23: qty 2

## 2021-04-23 MED ORDER — ALUM & MAG HYDROXIDE-SIMETH 200-200-20 MG/5ML PO SUSP
30.0000 mL | Freq: Once | ORAL | Status: AC
  Administered 2021-04-23: 30 mL via ORAL
  Filled 2021-04-23: qty 30

## 2021-04-23 MED ORDER — LIDOCAINE VISCOUS HCL 2 % MT SOLN
15.0000 mL | Freq: Once | OROMUCOSAL | Status: AC
  Administered 2021-04-23: 15 mL via ORAL
  Filled 2021-04-23: qty 15

## 2021-04-23 MED ORDER — PANTOPRAZOLE SODIUM 40 MG PO TBEC: 40.0000 mg | DELAYED_RELEASE_TABLET | Freq: Every day | ORAL | 1 refills | Status: AC

## 2021-04-23 MED ORDER — SUCRALFATE 1 G PO TABS: 1.0000 g | ORAL_TABLET | Freq: Three times a day (TID) | ORAL | 0 refills | Status: AC

## 2021-04-23 NOTE — ED Notes (Signed)
Due to pain level, pt medicated during [place].

## 2021-04-23 NOTE — ED Provider Notes (Signed)
[facility] Provider Note    Event Date/Time   First MD Initiated Contact with Patient 04/23/21 [phone]     (approximate)   History   Abdominal Pain   HPI  Breanna Carrillo is a 26 y.o. female who presents with complaints of upper abdominal pain.  Patient describes epigastric and right upper quadrant abdominal pain which has been ongoing for over a week now.  She saw her PCP and has been scheduled for a general surgery appointment to evaluate her gallbladder.  She reports she has lost weight and her pain seems to flareup every time she eats something greasy.  No history of abdominal surgeries.  This morning reports blood in her stool, positive vomiting, no hematemesis     Physical Exam   Triage Vital Signs: ED Triage Vitals  Enc Vitals Group     BP 04/23/21 0734 127/90     Pulse Rate 04/23/21 0734 (!) 116     Resp 04/23/21 0734 16     Temp 04/23/21 0734 98.5 F (36.9 C)     Temp Source 04/23/21 0734 Oral     SpO2 04/23/21 0734 99 %     Weight 04/23/21 0735 81.6 kg (180 lb)     Height 04/23/21 0735 1.702 m (5\' 7" )     Head Circumference --      Peak Flow --      Pain Score 04/23/21 0735 8     Pain Loc --      Pain Edu? --      Excl. in [place]? --     Most recent vital signs: Vitals:   04/23/21 0930 04/23/21 1100  BP: 110/61 113/70  Pulse: 85 79  Resp: 18 18  Temp:    SpO2: 98% 99%     General: Awake,  CV:  Good peripheral perfusion.  Resp:  Normal effort.  Clear auscultation bilaterally Abd:  No distention.  Mild tenderness in the right upper quadrant and epigastrium, nonsurgical abdomen, no CVA tenderness Other:     ED Results / Procedures / Treatments   Labs (all labs ordered are listed, but only abnormal results are displayed) Labs Reviewed  COMPREHENSIVE METABOLIC PANEL - Abnormal; Notable for the following components:      Result Value   Glucose, Bld 118 (*)    All other components within normal limits  URINALYSIS, ROUTINE W  REFLEX MICROSCOPIC - Abnormal; Notable for the following components:   Color, Urine YELLOW (*)    APPearance CLOUDY (*)    Hgb urine dipstick SMALL (*)    Ketones, ur 5 (*)    Protein, ur 30 (*)    Leukocytes,Ua LARGE (*)    Bacteria, UA RARE (*)    All other components within normal limits  POC URINE PREG, ED - Normal  LIPASE, BLOOD  CBC  TYPE AND SCREEN  TYPE AND SCREEN     EKG     RADIOLOGY Ultrasound right upper quadrant reviewed by me, no acute abnormality    PROCEDURES:  Critical Care performed:   Procedures   MEDICATIONS ORDERED IN ED: Medications  morphine (PF) 4 MG/ML injection 4 mg (4 mg Intravenous Given 04/23/21 0811)  ondansetron (ZOFRAN) injection 4 mg (4 mg Intravenous Given 04/23/21 0811)  pantoprazole (PROTONIX) injection 40 mg (40 mg Intravenous Given 04/23/21 0812)  alum & mag hydroxide-simeth (MAALOX/MYLANTA) 200-200-20 MG/5ML suspension 30 mL (30 mLs Oral Given 04/23/21 1117)    And  lidocaine (XYLOCAINE) 2 % viscous  mouth solution 15 mL (15 mLs Oral Given 04/23/21 1117)     IMPRESSION / MDM / ASSESSMENT AND PLAN / ED COURSE  I reviewed the triage vital signs and the nursing notes.  Patient presents with upper abdominal pain as detailed above.  Differential includes gastritis, PUD, pancreatitis and cholelithiasis  We will give IV morphine, IV Zofran and IV Protonix  Pending labs.  Will obtain ultrasound of the right upper quadrant.   ----------------------------------------- 8:43 AM on 04/23/2021 ----------------------------------------- Ultrasound without evidence of cholelithiasis or cholecystitis per radiology   Lab work reviewed and is reassuring, normal CMP, normal CBC  Lipase is normal  Patient treated with GI cocktail with significant improvement.  Given normal ultrasound strongly suspect gastritis/PUD.  We will start the patient on Protonix and Carafate with outpatient follow-up with GI for possible endoscopy if no improvement  patient agrees with this plan            FINAL CLINICAL IMPRESSION(S) / ED DIAGNOSES   Final diagnoses:  Upper abdominal pain  Acute gastritis, presence of bleeding unspecified, unspecified gastritis type     Rx / DC Orders   ED Discharge Orders          Ordered    pantoprazole (PROTONIX) 40 MG tablet  Daily        04/23/21 1216    sucralfate (CARAFATE) 1 g tablet  3 times daily with meals & bedtime        04/23/21 1216             Note:  This document was prepared using Dragon voice recognition software and may include unintentional dictation errors.   Lavonia Drafts, MD 04/23/21 1220

## 2021-04-23 NOTE — ED Notes (Signed)
First Nurse Note:  Pt to ED via POV c/o RUQ abdominal pain. Pt states that she has been having issues with her gallbladder and is supposed to be seeing a Careers adviser but has not be able to yet. Pt is in NAD.

## 2021-04-23 NOTE — ED Triage Notes (Signed)
Pt here with abd pain. Pt states that she believes that it is her gallbladder, seen by another provider who set her up with a GI doc but she was unable to bear the pain. Pt had 1 episode of bloody emesis. Pt tearful in triage.

## 2021-04-26 ENCOUNTER — Telehealth: Payer: Self-pay

## 2021-04-26 DIAGNOSIS — R1011 Right upper quadrant pain: Secondary | ICD-10-CM | POA: Diagnosis not present

## 2021-04-26 DIAGNOSIS — R1110 Vomiting, unspecified: Secondary | ICD-10-CM | POA: Diagnosis not present

## 2021-04-26 NOTE — Telephone Encounter (Signed)
Transition Care Management Unsuccessful Follow-up Telephone Call ° °Date of discharge and from where:  04/23/2021-[facility] ° °Attempts:  1st Attempt ° °Reason for unsuccessful TCM follow-up call:  Left voice message ° °  °

## 2021-04-27 ENCOUNTER — Other Ambulatory Visit: Payer: Self-pay | Admitting: General Surgery

## 2021-04-27 DIAGNOSIS — R1011 Right upper quadrant pain: Secondary | ICD-10-CM | POA: Diagnosis not present

## 2021-04-27 NOTE — Telephone Encounter (Signed)
Patient is seen by [facility].

## 2021-04-29 ENCOUNTER — Observation Stay
Admission: EM | Admit: 2021-04-29 | Discharge: 2021-05-01 | Disposition: A | Discharge status: Home / Self Care | Payer: Medicaid Other | Attending: Student in an Organized Health Care Education/Training Program | Admitting: Student in an Organized Health Care Education/Training Program

## 2021-04-29 ENCOUNTER — Other Ambulatory Visit: Payer: Self-pay

## 2021-04-29 ENCOUNTER — Emergency Department: Payer: Medicaid Other

## 2021-04-29 DIAGNOSIS — J45909 Unspecified asthma, uncomplicated: Secondary | ICD-10-CM | POA: Diagnosis not present

## 2021-04-29 DIAGNOSIS — Z79899 Other long term (current) drug therapy: Secondary | ICD-10-CM | POA: Insufficient documentation

## 2021-04-29 DIAGNOSIS — E440 Moderate protein-calorie malnutrition: Secondary | ICD-10-CM | POA: Insufficient documentation

## 2021-04-29 DIAGNOSIS — R109 Unspecified abdominal pain: Secondary | ICD-10-CM | POA: Diagnosis not present

## 2021-04-29 DIAGNOSIS — R1011 Right upper quadrant pain: Principal | ICD-10-CM | POA: Insufficient documentation

## 2021-04-29 DIAGNOSIS — Z20822 Contact with and (suspected) exposure to covid-19: Secondary | ICD-10-CM | POA: Insufficient documentation

## 2021-04-29 DIAGNOSIS — R1110 Vomiting, unspecified: Secondary | ICD-10-CM | POA: Diagnosis not present

## 2021-04-29 DIAGNOSIS — Z9189 Other specified personal risk factors, not elsewhere classified: Secondary | ICD-10-CM

## 2021-04-29 DIAGNOSIS — E46 Unspecified protein-calorie malnutrition: Secondary | ICD-10-CM | POA: Diagnosis not present

## 2021-04-29 DIAGNOSIS — R1010 Upper abdominal pain, unspecified: Secondary | ICD-10-CM

## 2021-04-29 LAB — CBC
HCT: 43.2 % (ref 36.0–46.0)
Hemoglobin: 14.4 g/dL (ref 12.0–15.0)
MCH: 29.7 pg (ref 26.0–34.0)
MCHC: 33.3 g/dL (ref 30.0–36.0)
MCV: 89.1 fL (ref 80.0–100.0)
Platelets: 321 10*3/uL (ref 150–400)
RBC: 4.85 MIL/uL (ref 3.87–5.11)
RDW: 12.4 % (ref 11.5–15.5)
WBC: 8.8 10*3/uL (ref 4.0–10.5)
nRBC: 0 % (ref 0.0–0.2)

## 2021-04-29 LAB — COMPREHENSIVE METABOLIC PANEL
ALT: 11 U/L (ref 0–44)
AST: 15 U/L (ref 15–41)
Albumin: 4.4 g/dL (ref 3.5–5.0)
Alkaline Phosphatase: 63 U/L (ref 38–126)
Anion gap: 8 (ref 5–15)
BUN: 12 mg/dL (ref 6–20)
CO2: 23 mmol/L (ref 22–32)
Calcium: 9.3 mg/dL (ref 8.9–10.3)
Chloride: 106 mmol/L (ref 98–111)
Creatinine, Ser: 0.63 mg/dL (ref 0.44–1.00)
GFR, Estimated: >60 mL/min (ref >60)
Glucose, Bld: 96 mg/dL (ref 70–99)
Potassium: 3.9 mmol/L (ref 3.5–5.1)
Sodium: 137 mmol/L (ref 135–145)
Total Bilirubin: 0.5 mg/dL (ref 0.3–1.2)
Total Protein: 8.1 g/dL (ref 6.5–8.1)

## 2021-04-29 LAB — RESP PANEL BY RT-PCR (FLU A&B, COVID) ARPGX2
Influenza A by PCR: NEGATIVE
Influenza B by PCR: NEGATIVE
SARS Coronavirus 2 by RT PCR: NEGATIVE

## 2021-04-29 LAB — POC URINE PREG, ED: Preg Test, Ur: NEGATIVE

## 2021-04-29 LAB — URINALYSIS, ROUTINE W REFLEX MICROSCOPIC
Bilirubin Urine: NEGATIVE
Glucose, UA: NEGATIVE mg/dL
Hgb urine dipstick: NEGATIVE
Ketones, ur: NEGATIVE mg/dL
Leukocytes,Ua: NEGATIVE
Nitrite: NEGATIVE
Protein, ur: NEGATIVE mg/dL
Specific Gravity, Urine: 1.018 (ref 1.005–1.030)
pH: 9 — ABNORMAL HIGH (ref 5.0–8.0)

## 2021-04-29 LAB — LIPASE, BLOOD: Lipase: 39 U/L (ref 11–51)

## 2021-04-29 MED ORDER — ACETAMINOPHEN 650 MG RE SUPP: 650.0000 mg | Freq: Four times a day (QID) | RECTAL | Status: DC | PRN

## 2021-04-29 MED ORDER — MORPHINE SULFATE (PF) 4 MG/ML IV SOLN
4.0000 mg | Freq: Once | INTRAVENOUS | Status: AC
  Administered 2021-04-29: 4 mg via INTRAVENOUS
  Filled 2021-04-29: qty 1

## 2021-04-29 MED ORDER — DIPHENHYDRAMINE HCL 50 MG/ML IJ SOLN
12.5000 mg | Freq: Once | INTRAMUSCULAR | Status: AC
  Administered 2021-04-29: 12.5 mg via INTRAVENOUS
  Filled 2021-04-29: qty 1

## 2021-04-29 MED ORDER — BISACODYL 5 MG PO TBEC: 5.0000 mg | DELAYED_RELEASE_TABLET | Freq: Every day | ORAL | Status: DC | PRN

## 2021-04-29 MED ORDER — ACETAMINOPHEN 325 MG PO TABS
650.0000 mg | ORAL_TABLET | Freq: Four times a day (QID) | ORAL | Status: DC | PRN
  Administered 2021-04-30 (×2): 650 mg via ORAL
  Filled 2021-04-29 (×2): qty 2

## 2021-04-29 MED ORDER — FENTANYL CITRATE PF 50 MCG/ML IJ SOSY
12.5000 ug | PREFILLED_SYRINGE | Freq: Once | INTRAMUSCULAR | Status: AC | PRN
  Administered 2021-04-29: 12.5 ug via INTRAVENOUS
  Filled 2021-04-29: qty 1

## 2021-04-29 MED ORDER — SODIUM CHLORIDE 0.9 % IV BOLUS
1000.0000 mL | Freq: Once | INTRAVENOUS | Status: AC
Start: 2021-04-29 — End: 2021-04-29
  Administered 2021-04-29: 1000 mL via INTRAVENOUS

## 2021-04-29 MED ORDER — POLYETHYLENE GLYCOL 3350 17 G PO PACK
17.0000 g | PACK | Freq: Every day | ORAL | Status: DC
  Administered 2021-04-29: 17 g via ORAL
  Filled 2021-04-29: qty 1

## 2021-04-29 MED ORDER — IOHEXOL 300 MG/ML  SOLN
100.0000 mL | Freq: Once | INTRAMUSCULAR | Status: AC | PRN
  Administered 2021-04-29: 100 mL via INTRAVENOUS
  Filled 2021-04-29: qty 100

## 2021-04-29 MED ORDER — FENTANYL CITRATE PF 50 MCG/ML IJ SOSY
50.0000 ug | PREFILLED_SYRINGE | Freq: Once | INTRAMUSCULAR | Status: AC
  Administered 2021-04-29: 50 ug via INTRAVENOUS
  Filled 2021-04-29: qty 1

## 2021-04-29 MED ORDER — OXYCODONE HCL 5 MG PO TABS
5.0000 mg | ORAL_TABLET | ORAL | Status: AC
  Administered 2021-04-29: 5 mg via ORAL
  Filled 2021-04-29: qty 1

## 2021-04-29 MED ORDER — ONDANSETRON HCL 4 MG/2ML IJ SOLN
4.0000 mg | Freq: Once | INTRAMUSCULAR | Status: AC
  Administered 2021-04-29: 4 mg via INTRAVENOUS
  Filled 2021-04-29: qty 2

## 2021-04-29 MED ORDER — HEPARIN SODIUM (PORCINE) 5000 UNIT/ML IJ SOLN
5000.0000 [IU] | Freq: Three times a day (TID) | INTRAMUSCULAR | Status: DC
  Administered 2021-04-30 (×3): 5000 [IU] via SUBCUTANEOUS
  Filled 2021-04-29 (×3): qty 1

## 2021-04-29 NOTE — H&P (Signed)
History and Physical    Patient: Breanna Carrillo [id number] DOB: 1995-09-27 DOA: 04/29/2021 DOS: the patient was seen and examined on 04/29/2021 PCP: [facility]  Patient coming from: Home  Chief Complaint:  Chief Complaint  Patient presents with   Abdominal Pain    HPI: Breanna Carrillo is a 26 y.o. female with medical history significant of asthma. She presents to the ED on 2/16 with history of RUQ pain x ~14 days.  She was seen in the ED 2/10 and evaluated for gallbladder disease but workup was negative so was discharged after pain was managed. She was set up for outpatient follow up with general surgery but had not had an appointment yet.  Patient presents today describing unbearable pain in RUQ. She states it is a sharp pain and worsened after eating greasy foods.  She endorses constipation and blood streaked stool. Last BM several days ago She endorses nausea and vomiting. She denies dysuria. No history of abdominal surgeries.   Past Medical History:  Diagnosis Date   Asthma    Pregnancy induced hypertension    Past Surgical History:  Procedure Laterality Date   JOINT REPLACEMENT     R elbow   Social History:  reports that she has never smoked. She has never used smokeless tobacco. She reports that she does not drink alcohol and does not use drugs.  No Known Allergies  Family History  Problem Relation Age of Onset   Hypertension Father    Diabetes Father    Migraines Father    Diabetes Maternal Grandmother    Hypertension Maternal Grandmother    Stroke Maternal Grandmother    Heart disease Maternal Grandmother    Hypertension Paternal Grandmother    Cirrhosis Maternal Grandfather    Cancer Paternal Grandfather        bone marrow    Prior to Admission medications   Medication Sig Start Date End Date Taking? Authorizing Provider  cyclobenzaprine (FLEXERIL) 5 MG tablet Take 1 tablet (5 mg total) by mouth 3 (three) times daily as  needed. Patient not taking: Reported on 04/23/2021 01/22/20   Menshew, Dannielle Karvonen, PA-C  guaiFENesin-codeine 100-10 MG/5ML syrup Take 5 mLs by mouth every 6 (six) hours as needed for cough. Patient not taking: Reported on 04/23/2021 03/04/20   Johnn Hai, PA-C  ibuprofen (ADVIL) 800 MG tablet Take 1 tablet (800 mg total) by mouth every 8 (eight) hours as needed. Patient not taking: Reported on 04/23/2021 01/22/20   Menshew, Dannielle Karvonen, PA-C  ondansetron (ZOFRAN) 4 MG tablet Take 4 mg by mouth every 8 (eight) hours as needed. 04/21/21 05/01/21  [provider]  pantoprazole (PROTONIX) 40 MG tablet Take 1 tablet (40 mg total) by mouth daily. 04/23/21 04/23/22  Lavonia Drafts, MD  Prenatal Vit-Fe Fumarate-FA (PRENATAL MULTIVITAMIN) TABS tablet Take 1 tablet by mouth daily at 12 noon. Patient not taking: Reported on 04/23/2021    [provider]  senna-docusate (SENOKOT-S) 8.6-50 MG tablet Take 2 tablets by mouth daily. Patient not taking: Reported on 04/23/2021 01/15/17   Minott, Ebony Cargo, MD  sertraline (ZOLOFT) 25 MG tablet Take 1 tablet (25 mg total) daily by mouth. Patient not taking: Reported on 04/23/2021 01/30/17   Lavonia Drafts, MD  sertraline (ZOLOFT) 50 MG tablet Take 1 tablet (50 mg total) daily by mouth. Patient not taking: Reported on 04/23/2021 01/30/17   Lavonia Drafts, MD  sucralfate (CARAFATE) 1 g tablet Take 1 tablet (1 g total) by  mouth 4 (four) times daily -  with meals and at bedtime for 15 days. 04/23/21 05/08/21  Lavonia Drafts, MD  venlafaxine XR (EFFEXOR-XR) 37.5 MG 24 hr capsule Take 37.5 mg by mouth daily. Patient not taking: Reported on 04/23/2021 02/23/18   [provider]    Physical Exam: Vitals:   04/29/21 1402 04/29/21 1538 04/29/21 2007 04/29/21 2126  BP:  120/88 (!) 109/58 115/77  Pulse:  100 94 91  Resp:  18 19 18   Temp:   98.3 F (36.8 C) 98.2 F (36.8 C)  TempSrc:   Oral Oral  SpO2:  100% 100% 100%   Weight: 81.6 kg     Height: 5\' 7"  (1.702 m)      General: resting comfortably Pulm: normal respiratory effort Neuro: asleep  Data Reviewed: Lipase negative CBC, CMP normal Urine pregnancy negative Urinalysis unremarkable  Abdominal CT- negative for acute intra-abdominal process.  Mild increased stool burden   RUQ [place] on 2/10- no abnormalities  Assessment and Plan:  RUQ pain- for about 2 weeks. differential includes gallbladder disease, constipation, acute viral infection. No gallstones or cholecystitis seen on recent RUQ [place]. No acute findings to explain pain on CT this presentation. CMP is unremarkable including transaminases.  - general surgery following, appreciate recs  - HIDA scan in morning   - no narcotics after midnight   - NPO midnight - acute hepatitis panel am - GI consulted  - planning EGD if HIDA scan negative - anti-emetics and analgesia PRN - CMP am   Advance Care Planning:   Code Status: Full Code   Consults: GI, general surgery  Family Communication: none  Severity of Illness: The appropriate patient status for this patient is OBSERVATION. Observation status is judged to be reasonable and necessary in order to provide the required intensity of service to ensure the patient's safety. The patient's presenting symptoms, physical exam findings, and initial radiographic and laboratory data in the context of their medical condition is felt to place them at decreased risk for further clinical deterioration. Furthermore, it is anticipated that the patient will be medically stable for discharge from the hospital within 2 midnights of admission.   Author: Richarda Osmond, MD 04/29/2021 10:03 PM  For on call review [email or web address].

## 2021-04-29 NOTE — ED Notes (Signed)
Pt states she gave urine sample in triage, will follow up

## 2021-04-29 NOTE — ED Triage Notes (Signed)
Pt comes with c/o abdominal pain since last Friday. Pt states no BM since last week and blood was in it. Pt states N/V

## 2021-04-29 NOTE — ED Notes (Signed)
Covid swab collected at 2017 and sent to lab at 2018.

## 2021-04-29 NOTE — ED Notes (Signed)
See triage note  presents with RUQ pain   describes pain as burning type  has had 2 abd u/s  which were negative states she is having increased pain and last BM was 6 days   positive n/v

## 2021-04-29 NOTE — Progress Notes (Signed)
Full h&p to follow.   Patient may not have narcotic pain medications or anything PO after midnight tonight to avoid causing inaccurate results from scheduled HIDA scan tomorrow morning.   Please contact [facility] via provider on amion if requiring pain medication after that point.

## 2021-04-29 NOTE — ED Notes (Signed)
Request made for transport to the floor ?

## 2021-04-29 NOTE — ED Provider Notes (Signed)
[facility] Provider Note    Event Date/Time   First MD Initiated Contact with Patient 04/29/21 1355     (approximate)   History   Abdominal Pain   HPI  Breanna Carrillo is a 26 y.o. female with history of right upper quadrant pain presents emergency department with worsening right upper quadrant pain and abdominal pain.  Patient states she saw Dr. Trisha Mangle and had a ultrasound of the gallbladder.  Showed that the gallbladder was dilated neck was closed but no gallstones.  He is going to run a HIDA scan study next week.  States she does not think she can make it that long because that she has severe right upper quadrant pain and abdominal pain that radiates into her back and shoulders.  She denies any fever or chills.  Has had multiple episodes of nausea and vomiting.  States now she is afraid to use the bathroom as she had some blood in her stool.  States stool was green with blood mixed in it.  And now feels constipated.      Physical Exam   Triage Vital Signs: ED Triage Vitals  Enc Vitals Group     BP 04/29/21 1257 (!) 127/101     Pulse Rate 04/29/21 1257 (!) 111     Resp 04/29/21 1257 19     Temp 04/29/21 1257 98.5 F (36.9 C)     Temp src --      SpO2 04/29/21 1257 100 %     Weight 04/29/21 1402 179 lb 14.3 oz (81.6 kg)     Height 04/29/21 1402 5\' 7"  (1.702 m)     Head Circumference --      Peak Flow --      Pain Score 04/29/21 1256 10     Pain Loc --      Pain Edu? --      Excl. in GC? --     Most recent vital signs: Vitals:   04/29/21 1257 04/29/21 1538  BP: (!) 127/101 120/88  Pulse: (!) 111 100  Resp: 19 18  Temp: 98.5 F (36.9 C)   SpO2: 100% 100%     General: Awake, no distress.   CV:  Good peripheral perfusion. regular rate and  rhythm Resp:  Normal effort. Lungs CTA Abd:  No distention.  Tender in the right upper quadrant and midepigastric area Other:      ED Results / Procedures / Treatments   Labs (all labs ordered  are listed, but only abnormal results are displayed) Labs Reviewed  URINALYSIS, ROUTINE W REFLEX MICROSCOPIC - Abnormal; Notable for the following components:      Result Value   Color, Urine YELLOW (*)    APPearance CLEAR (*)    pH 9.0 (*)    All other components within normal limits  RESP PANEL BY RT-PCR (FLU A&B, COVID) ARPGX2  LIPASE, BLOOD  COMPREHENSIVE METABOLIC PANEL  CBC  POC URINE PREG, ED     EKG     RADIOLOGY CT abdomen/pelvis IV contrast    PROCEDURES:   Procedures   MEDICATIONS ORDERED IN ED: Medications  diphenhydrAMINE (BENADRYL) injection 12.5 mg (has no administration in time range)  fentaNYL (SUBLIMAZE) injection 50 mcg (has no administration in time range)  sodium chloride 0.9 % bolus 1,000 mL (1,000 mLs Intravenous New Bag/Given 04/29/21 1505)  ondansetron (ZOFRAN) injection 4 mg (4 mg Intravenous Given 04/29/21 1513)  morphine (PF) 4 MG/ML injection 4 mg (4 mg Intravenous  Given 04/29/21 1515)  iohexol (OMNIPAQUE) 300 MG/ML solution 100 mL (100 mLs Intravenous Contrast Given 04/29/21 1544)     IMPRESSION / MDM / ASSESSMENT AND PLAN / ED COURSE  I reviewed the triage vital signs and the nursing notes.                              Differential diagnosis includes, but is not limited to, pancreatitis, acute cholecystitis, SBO,   The patient's labs are reassuring, comprehensive metabolic panel CBC, lipase, urinalysis and urine pregnant are all negative  CT abdomen/pelvis was independently reviewed by me.  Read by radiology as normal.  Consult to Dr. Tonna Boehringer from surgery, would like HIDA scan  Consult to Dr. Gerhard Munch from radiology, states it is okay to order HIDA scan for surgery. Discussed with our radiology techs, there is no nuclear med tech on-call.  Spoke with Chelsea from nuclear med who states they may not even have enough material to do the HIDA scan but could put her in a slot for tomorrow morning.  Dr. Tonna Boehringer and Dr. Maia Plan would like for the  hospitalist to admit and they will consult.  If HIDA scan is negative tomorrow we will do upper endoscopy.  Consult hospitalist Spoke with Dr. Dareen Piano, she will be admitting for abdominal pain. Notified nuclear medicine.  They said to make sure she eats dinner tonight still helps HIDA scan and also keep her n.p.o. after midnight and no narcotic pain medication after midnight.  This information was conveyed to the hospitalist.  Patient is being admitted in stable condition.     FINAL CLINICAL IMPRESSION(S) / ED DIAGNOSES   Final diagnoses:  Pain of upper abdomen  At risk for inadequate pain control     Rx / DC Orders   ED Discharge Orders     None        Note:  This document was prepared using Dragon voice recognition software and may include unintentional dictation errors.    Faythe Ghee, PA-C 04/29/21 1726    Gilles Chiquito, MD 04/29/21 1857    Gilles Chiquito, MD 04/29/21 1859

## 2021-04-30 ENCOUNTER — Observation Stay: Payer: Medicaid Other

## 2021-04-30 ENCOUNTER — Encounter: Payer: Self-pay | Admitting: Student in an Organized Health Care Education/Training Program

## 2021-04-30 DIAGNOSIS — R1010 Upper abdominal pain, unspecified: Secondary | ICD-10-CM | POA: Diagnosis not present

## 2021-04-30 DIAGNOSIS — R1011 Right upper quadrant pain: Secondary | ICD-10-CM | POA: Diagnosis not present

## 2021-04-30 LAB — BASIC METABOLIC PANEL
Anion gap: 7 (ref 5–15)
BUN: 10 mg/dL (ref 6–20)
CO2: 21 mmol/L — ABNORMAL LOW (ref 22–32)
Calcium: 8.7 mg/dL — ABNORMAL LOW (ref 8.9–10.3)
Chloride: 109 mmol/L (ref 98–111)
Creatinine, Ser: 0.52 mg/dL (ref 0.44–1.00)
GFR, Estimated: >60 mL/min (ref >60)
Glucose, Bld: 84 mg/dL (ref 70–99)
Potassium: 4 mmol/L (ref 3.5–5.1)
Sodium: 137 mmol/L (ref 135–145)

## 2021-04-30 LAB — CBC
HCT: 38.5 % (ref 36.0–46.0)
Hemoglobin: 13.2 g/dL (ref 12.0–15.0)
MCH: 30.8 pg (ref 26.0–34.0)
MCHC: 34.3 g/dL (ref 30.0–36.0)
MCV: 89.7 fL (ref 80.0–100.0)
Platelets: 257 10*3/uL (ref 150–400)
RBC: 4.29 MIL/uL (ref 3.87–5.11)
RDW: 12.5 % (ref 11.5–15.5)
WBC: 7.8 10*3/uL (ref 4.0–10.5)
nRBC: 0 % (ref 0.0–0.2)

## 2021-04-30 LAB — HIV ANTIBODY (ROUTINE TESTING W REFLEX): HIV Screen 4th Generation wRfx: NONREACTIVE

## 2021-04-30 LAB — HEPATITIS PANEL, ACUTE
HCV Ab: NONREACTIVE
Hep A IgM: NONREACTIVE
Hep B C IgM: NONREACTIVE
Hepatitis B Surface Ag: NONREACTIVE

## 2021-04-30 MED ORDER — BISACODYL 5 MG PO TBEC
10.0000 mg | DELAYED_RELEASE_TABLET | Freq: Once | ORAL | Status: AC
  Administered 2021-04-30: 10 mg via ORAL
  Filled 2021-04-30: qty 2

## 2021-04-30 MED ORDER — TECHNETIUM TC 99M MEBROFENIN IV KIT
5.0500 | PACK | Freq: Once | INTRAVENOUS | Status: AC | PRN
  Administered 2021-04-30: 5.05 via INTRAVENOUS

## 2021-04-30 MED ORDER — MAGNESIUM HYDROXIDE 400 MG/5ML PO SUSP
30.0000 mL | Freq: Once | ORAL | Status: AC
  Administered 2021-04-30: 30 mL via ORAL
  Filled 2021-04-30: qty 30

## 2021-04-30 MED ORDER — ADULT MULTIVITAMIN W/MINERALS CH
1.0000 | ORAL_TABLET | Freq: Every day | ORAL | Status: DC
  Administered 2021-04-30: 1 via ORAL
  Filled 2021-04-30: qty 1

## 2021-04-30 MED ORDER — BOOST / RESOURCE BREEZE PO LIQD CUSTOM
1.0000 | Freq: Three times a day (TID) | ORAL | Status: DC
Start: 2021-04-30 — End: 2021-05-01

## 2021-04-30 MED ORDER — ONDANSETRON HCL 4 MG/2ML IJ SOLN
4.0000 mg | Freq: Four times a day (QID) | INTRAMUSCULAR | Status: DC | PRN
  Administered 2021-04-30 (×2): 4 mg via INTRAVENOUS
  Filled 2021-04-30 (×2): qty 2

## 2021-04-30 NOTE — TOC Initial Note (Signed)
Transition of Care [facility]) - Initial/Assessment Note    Patient Details  Name: Breanna Carrillo MRN: [id number] Date of Birth: September 09, 1995  Transition of [facility]) CM/SW Contact:    Chapman Fitch, RN Phone Number: 04/30/2021, 3:12 PM  Clinical Narrative:                     Transition of Care [facility]) Screening Note   Patient Details  Name: Breanna Carrillo Date of Birth: [birth year]   Transition of Care [facility]) CM/SW Contact:    Chapman Fitch, RN Phone Number: 04/30/2021, 3:12 PM    Transition of Care Department [facility]) has reviewed patient and no TOC needs have been identified at this time. We will continue to monitor patient advancement through interdisciplinary progression rounds. If new patient transition needs arise, please place a TOC consult.       Patient Goals and CMS Choice        Expected Discharge Plan and Services                                                Prior Living Arrangements/Services                       Activities of Daily Living Home Assistive Devices/Equipment: None ADL Screening (condition at time of admission) Patient's cognitive ability adequate to safely complete daily activities?: Yes Is the patient deaf or have difficulty hearing?: No Does the patient have difficulty seeing, even when wearing glasses/contacts?: No Does the patient have difficulty concentrating, remembering, or making decisions?: No Patient able to express need for assistance with ADLs?: No Does the patient have difficulty dressing or bathing?: No Independently performs ADLs?: Yes (appropriate for developmental age) Does the patient have difficulty walking or climbing stairs?: No Weakness of Legs: None Weakness of Arms/Hands: None  Permission Sought/Granted                  Emotional Assessment              Admission diagnosis:  Pain of upper abdomen [R10.10] Abdominal pain [R10.9] At risk for inadequate pain control  [Z91.89] Patient Active Problem List   Diagnosis Date Noted   Abdominal pain 04/29/2021   Gestational hypertension, antepartum 11/22/2016   Supervision of high risk pregnancy, antepartum 07/27/2016   Hypercholesterolemia 05/04/2016   Obesity (BMI 35.0-39.9 without comorbidity) 05/04/2016   PCP:  [facility]:   [facility] [address], [place] - [address] [place] [address] Phone: [phone] Fax: [phone]     Social Determinants of Health (SDOH) Interventions    Readmission Risk Interventions No flowsheet data found.

## 2021-04-30 NOTE — Progress Notes (Signed)
PROGRESS NOTE  Breanna Carrillo    DOB: 26-Oct-1995, 26 y.o.  EPP:[id number]    Code Status: Full Code   DOA: 04/29/2021   LOS: 0   Brief hospital course  Breanna Carrillo is a 26 y.o. female with a PMH significant for asthma. They presented from home to the ED on 04/29/2021 with RUQ pain x ~14 days.  She was seen in the ED 2/10 and evaluated for gallbladder disease but workup was negative so was discharged after pain was managed. She was set up for outpatient follow up with general surgery but had not had an appointment yet. Patient presents to ED again this admission describing unbearable pain in RUQ. In the ED, it was found that they had no acute findings on abdominal CT and mostly unremarkable labs.  They were treated with analgesia and antiemetics. General surgery and GI were both consulted for further evaluation and management. Patient was admitted to medicine service for further workup and management of care as outlined in detail below.  04/30/21 -stable  Assessment & Plan  Principal Problem:   Abdominal pain  RUQ pain- for about 2 weeks. differential includes gallbladder disease, constipation, acute viral infection. No gallstones or cholecystitis seen on recent RUQ [place]. No acute findings to explain pain on CT this presentation. CMP is unremarkable including transaminases.  - general surgery following, appreciate recs             - HIDA scan today                         - patient currently NPO and will not receive opioids prior to scan. - acute hepatitis panel pending - GI consulted             - planning EGD if HIDA scan negative - anti-emetics and analgesia PRN - CMP am  Body mass index is 28.18 kg/m.  VTE ppx: heparin injection 5,000 Units Start: 04/29/21 2200   Diet:     Diet   Diet full liquid Room service appropriate? Yes; Fluid consistency: Thin   Subjective 04/30/21    Pt reports continued RUQ abdominal pain and nausea which is well controlled with zofran.    Objective   Vitals:   04/29/21 2007 04/29/21 2126 04/30/21 0600 04/30/21 0737  BP: (!) 109/58 115/77 111/68 115/68  Pulse: 94 91 88 78  Resp: 19 18 16 20   Temp: 98.3 F (36.8 C) 98.2 F (36.8 C) 99 F (37.2 C) 98.6 F (37 C)  TempSrc: Oral Oral Oral Oral  SpO2: 100% 100% 100% 100%  Weight:      Height:       No intake or output data in the 24 hours ending 04/30/21 0759 Filed Weights   04/29/21 1402  Weight: 81.6 kg     Physical Exam:  General: awake, alert, NAD HEENT: atraumatic, clear conjunctiva, anicteric sclera, MMM, hearing grossly normal Respiratory: normal respiratory effort. Cardiovascular: normal S1/S2, RRR, no JVD, murmurs, quick capillary refill  Gastrointestinal: soft, ND. Mildly tender to palpation upper quadrants. Nervous: A&O x3. no gross focal neurologic deficits, normal speech Extremities: moves all equally, no edema, normal tone Skin: dry, intact, normal temperature, normal color. No rashes, lesions or ulcers on exposed skin Psychiatry: normal mood, congruent affect  Labs   I have personally reviewed the following labs and imaging studies CBC    Component Value Date/Time   WBC 7.8 04/30/2021 0520   RBC 4.29 04/30/2021 0520  HGB 13.2 04/30/2021 0520   HGB 10.0 (L) 12/22/2016 0913   HCT 38.5 04/30/2021 0520   HCT 31.7 (L) 12/22/2016 0913   PLT 257 04/30/2021 0520   PLT 247 12/22/2016 0913   MCV 89.7 04/30/2021 0520   MCV 81 12/22/2016 0913   MCV 89 10/16/2013 1734   MCH 30.8 04/30/2021 0520   MCHC 34.3 04/30/2021 0520   RDW 12.5 04/30/2021 0520   RDW 14.4 12/22/2016 0913   RDW 13.3 10/16/2013 1734   LYMPHSABS 4.0 03/01/2019 0414   LYMPHSABS 1.9 07/27/2016 1052   LYMPHSABS 3.1 02/24/2013 2321   MONOABS 0.7 03/01/2019 0414   MONOABS 1.2 (H) 02/24/2013 2321   EOSABS 0.1 03/01/2019 0414   EOSABS 0.1 07/27/2016 1052   EOSABS 0.1 02/24/2013 2321   BASOSABS 0.1 03/01/2019 0414   BASOSABS 0.0 07/27/2016 1052   BASOSABS 0.1 02/24/2013 2321    BMP Latest Ref Rng & Units 04/30/2021 04/29/2021 04/23/2021  Glucose 70 - 99 mg/dL 84 96 993(Z)  BUN 6 - 20 mg/dL 10 12 15   Creatinine 0.44 - 1.00 mg/dL 1.69 6.78  BUN/Creat Ratio 9 - 23 - - -  Sodium 135 - 145 mmol/L 137 137 135  Potassium 3.5 - 5.1 mmol/L 4.0 3.9 3.6  Chloride 98 - 111 mmol/L 109 106 102  CO2 22 - 32 mmol/L 21(L) 23 25  Calcium 8.9 - 10.3 mg/dL 9.38) 9.3 9.1    CT ABDOMEN PELVIS W CONTRAST  Result Date: 04/29/2021 CLINICAL DATA:  Abdominal pain with nausea and vomiting. EXAM: CT ABDOMEN AND PELVIS WITH CONTRAST TECHNIQUE: Multidetector CT imaging of the abdomen and pelvis was performed using the standard protocol following bolus administration of intravenous contrast. RADIATION DOSE REDUCTION: This exam was performed according to the departmental dose-optimization program which includes automated exposure control, adjustment of the mA and/or kV according to patient size and/or use of iterative reconstruction technique. CONTRAST:  05/01/2021 OMNIPAQUE IOHEXOL 300 MG/ML  SOLN COMPARISON:  Right upper quadrant ultrasound dated April 23, 2021. CT abdomen pelvis dated October 20, 2015. FINDINGS: Lower chest: No acute abnormality. Hepatobiliary: No focal liver abnormality is seen. No gallstones, gallbladder wall thickening, or biliary dilatation. Pancreas: Unremarkable. No pancreatic ductal dilatation or surrounding inflammatory changes. Spleen: Normal in size without focal abnormality. Adrenals/Urinary Tract: Adrenal glands are unremarkable. Kidneys are normal, without renal calculi, focal lesion, or hydronephrosis. Bladder is unremarkable. Stomach/Bowel: Stomach is within normal limits. Appendix appears normal. No evidence of bowel wall thickening, distention, or inflammatory changes. Mildly increased stool burden in the right colon and rectum. Vascular/Lymphatic: No significant vascular findings are present. No enlarged abdominal or pelvic lymph nodes. Reproductive: Uterus and  bilateral adnexa are unremarkable. Other: No abdominal wall hernia or abnormality. No abdominopelvic ascites. No pneumoperitoneum. Musculoskeletal: No acute or significant osseous findings. IMPRESSION: 1. No acute intra-abdominal process. 2. Mildly increased stool burden in the right colon and rectum, consistent with reported history of constipation. Electronically Signed   By: October 22, 2015 M.D.   On: 04/29/2021 15:59    Disposition Plan & Communication  Patient status: Observation  Admitted From: Home Planned disposition location: Home Anticipated discharge date: 2/19 pending completing workup from general surgery and GI  Family Communication: none    Author: 3/19, DO [facility] 04/30/2021, 7:59 AM   Available by Epic secure chat 7AM-7PM. If 7PM-7AM, please contact night-coverage.  [facility] contact information found on 05/02/2021.

## 2021-04-30 NOTE — Progress Notes (Signed)
Initial Nutrition Assessment  DOCUMENTATION CODES:   Non-severe (moderate) malnutrition in context of acute illness/injury  INTERVENTION:  - will order Boost Breeze TID, each supplement provides 250 kcal and 9 grams of protein. - will order multivitamin with minerals daily - will monitor for need for diet education prior to d/c.   NUTRITION DIAGNOSIS:   Moderate Malnutrition related to acute illness as evidenced by mild fat depletion, mild muscle depletion.  GOAL:   Patient will meet greater than or equal to 90% of their needs  MONITOR:   PO intake, Supplement acceptance, Labs, Weight trends  REASON FOR ASSESSMENT:   Malnutrition Screening Tool  ASSESSMENT:   26 y.o. female with medical history of asthma and pregnancy-induced HTN. She presented to the ED on 04/29/21 due to worsening RUQ pain. She reported last BM was on 2/10.  Patient laying in bed with mom at bedside. Patient had attempted to eat lunch but was only able to eat a few bites of her salad.  She reports that abdominal pain began ~1.5 months ago and became worse at the beginning of January. She shares that greasy and dairy-based foods make abdominal pain worse. Abdominal pain did lead to nausea and she had zofran at home which was beneficial for her.  The last time she ate PTA was 3 chicken minis from [employer] for breakfast yesterday. She shares her last BM was on 2/10. She had miralax in apple juice yesterday which was not helpful.   She underwent HIDA scan today, pending results. She shares that plan is for cholecystectomy on 2/18.   Weight yesterday was 180 lb. PTA the most recently documented weight was 190 lb on 01/22/20. Patient reports that more recently she has weighed ~215 lb. She shares that she has lost 30 lb since 02/2021.   Labs reviewed; Ca: 8.7 mg/dl.  Medications reviewed; 17 g miralax/day.    NUTRITION - FOCUSED PHYSICAL EXAM:  Flowsheet Row Most Recent Value  Orbital Region No depletion   Upper Arm Region Mild depletion  Thoracic and Lumbar Region Unable to assess  Buccal Region Mild depletion  Temple Region No depletion  Clavicle Bone Region Mild depletion  Clavicle and Acromion Bone Region Mild depletion  Scapular Bone Region No depletion  Dorsal Hand No depletion  Patellar Region No depletion  Anterior Thigh Region No depletion  Posterior Calf Region Mild depletion  Edema (RD Assessment) None  Hair Reviewed  Eyes Reviewed  Mouth Reviewed  Skin Reviewed  Nails Unable to assess  [nail polish]       Diet Order:   Diet Order             Diet regular Room service appropriate? Yes; Fluid consistency: Thin  Diet effective ____                   EDUCATION NEEDS:   Education needs have been addressed  Skin:  Skin Assessment: Reviewed RN Assessment  Last BM:  2/10 per patient report  Height:   Ht Readings from Last 1 Encounters:  04/29/21 5\' 7"  (1.702 m)    Weight:   Wt Readings from Last 1 Encounters:  04/29/21 81.6 kg     BMI:  Body mass index is 28.18 kg/m.   Estimated Nutritional Needs:  Kcal:  2050-2300 kcal Protein:  105-120 grams Fluid:  >/= 2.2 L/day     05/01/21, MS, RD, LDN Inpatient Clinical Dietitian RD pager # available in AMION  After hours/weekend pager # available in  AMION

## 2021-04-30 NOTE — Consult Note (Signed)
SURGICAL CONSULTATION NOTE   HISTORY OF PRESENT ILLNESS (HPI):  26 y.o. female presented to [facility] ED for evaluation of abdominal pain.. Patient reports has been complaining of abdominal pain for the last month.  She endorses that the pain is mostly on the epigastric and right upper quadrant.  She understood that the pain is also exacerbated by eating.  She denies any aggravating factors.  Pain also radiates to her back.  I previously evaluated patient in my office a week ago for the right upper quadrant pain.  She was referred to my office due to right upper quadrant pain with negative ultrasound.  Abdominal ultrasound shows an normal gallbladder without gallstones.  Normal common bile duct diameter.  My assessment at that point was that the pain cannot be necessarily associated with biliary colic with a negative ultrasound.  I recommended HIDA scan for evaluation of gallbladder function and rule out gallbladder dyskinesia which used to differential diagnosis for right upper quadrant pain.  The patient presented to the ED before she was able to have the HIDA scan done with another attack of upper abdominal pain.  She had a CT scan of the abdomen and pelvis that shows no intra-abdominal pathology.  She was admitted for further management.  HIDA scan done today shows normalization fraction.  Patient is now also complaining of constipation.  She endorses that she has not had a bowel movement since almost a week.  She also endorses having rectal bleeding.  I personally evaluated the images of the abdominal ultrasound done last week, CT scan of the abdomen and pelvis done yesterday and HIDA scan done today.   Surgery is consulted by Dr. Ouida Sills in this context for evaluation and management of right upper current pain.  PAST MEDICAL HISTORY (PMH):  Past Medical History:  Diagnosis Date   Asthma    Pregnancy induced hypertension      PAST SURGICAL HISTORY ([place]):  Past Surgical History:  Procedure  Laterality Date   JOINT REPLACEMENT     R elbow     MEDICATIONS:  Prior to Admission medications   Medication Sig Start Date End Date Taking? Authorizing Provider  ondansetron (ZOFRAN) 4 MG tablet Take 4 mg by mouth every 8 (eight) hours as needed. 04/21/21 05/01/21 Yes [provider]  pantoprazole (PROTONIX) 40 MG tablet Take 1 tablet (40 mg total) by mouth daily. 04/23/21 04/23/22 Yes Lavonia Drafts, MD  sucralfate (CARAFATE) 1 g tablet Take 1 tablet (1 g total) by mouth 4 (four) times daily -  with meals and at bedtime for 15 days. 04/23/21 05/08/21 Yes Lavonia Drafts, MD  cyclobenzaprine (FLEXERIL) 5 MG tablet Take 1 tablet (5 mg total) by mouth 3 (three) times daily as needed. Patient not taking: Reported on 04/23/2021 01/22/20   Menshew, Dannielle Karvonen, PA-C  guaiFENesin-codeine 100-10 MG/5ML syrup Take 5 mLs by mouth every 6 (six) hours as needed for cough. Patient not taking: Reported on 04/23/2021 03/04/20   Johnn Hai, PA-C  ibuprofen (ADVIL) 800 MG tablet Take 1 tablet (800 mg total) by mouth every 8 (eight) hours as needed. Patient not taking: Reported on 04/23/2021 01/22/20   Menshew, Dannielle Karvonen, PA-C  Prenatal Vit-Fe Fumarate-FA (PRENATAL MULTIVITAMIN) TABS tablet Take 1 tablet by mouth daily at 12 noon. Patient not taking: Reported on 04/23/2021    [provider]  senna-docusate (SENOKOT-S) 8.6-50 MG tablet Take 2 tablets by mouth daily. Patient not taking: Reported on 04/23/2021 01/15/17   Minott, Ebony Cargo,  MD  sertraline (ZOLOFT) 25 MG tablet Take 1 tablet (25 mg total) daily by mouth. Patient not taking: Reported on 04/23/2021 01/30/17   Lavonia Drafts, MD  sertraline (ZOLOFT) 50 MG tablet Take 1 tablet (50 mg total) daily by mouth. Patient not taking: Reported on 04/23/2021 01/30/17   Lavonia Drafts, MD  venlafaxine XR (EFFEXOR-XR) 37.5 MG 24 hr capsule Take 37.5 mg by mouth daily. Patient not taking: Reported on 04/23/2021 02/23/18    [provider]     ALLERGIES:  No Known Allergies   SOCIAL HISTORY:  Social History   Socioeconomic History   Marital status: Single    Spouse name: Not on file   Number of children: Not on file   Years of education: Not on file   Highest education level: Not on file  Occupational History   Not on file  Tobacco Use   Smoking status: Never   Smokeless tobacco: Never  Vaping Use   Vaping Use: Never used  Substance and Sexual Activity   Alcohol use: No   Drug use: No   Sexual activity: Yes  Other Topics Concern   Not on file  Social History Narrative   Not on file   Social Determinants of Health   Financial Resource Strain: Not on file  Food Insecurity: Not on file  Transportation Needs: Not on file  Physical Activity: Not on file  Stress: Not on file  Social Connections: Not on file  Intimate Partner Violence: Not on file      FAMILY HISTORY:  Family History  Problem Relation Age of Onset   Hypertension Father    Diabetes Father    Migraines Father    Diabetes Maternal Grandmother    Hypertension Maternal Grandmother    Stroke Maternal Grandmother    Heart disease Maternal Grandmother    Hypertension Paternal Grandmother    Cirrhosis Maternal Grandfather    Cancer Paternal Grandfather        bone marrow     REVIEW OF SYSTEMS:  Constitutional: denies weight loss, fever, chills, or sweats  Eyes: denies any other vision changes, history of eye injury  ENT: denies sore throat, hearing problems  Respiratory: denies shortness of breath, wheezing  Cardiovascular: denies chest pain, palpitations  Gastrointestinal: positive abdominal pain, nausea and vomiting, constipation, rectal bleeding Genitourinary: denies burning with urination or urinary frequency Musculoskeletal: denies any other joint pains or cramps  Skin: denies any other rashes or skin discolorations  Neurological: denies any other headache, dizziness, weakness  Psychiatric: denies any  other depression, anxiety   All other review of systems were negative   VITAL SIGNS:  Temp:  [98.2 F (36.8 C)-99 F (37.2 C)] 98.6 F (37 C) (02/17 0737) Pulse Rate:  [78-100] 78 (02/17 0737) Resp:  [16-20] 20 (02/17 0737) BP: (109-120)/(58-88) 115/68 (02/17 0737) SpO2:  [100 %] 100 % (02/17 0737) Weight:  [81.6 kg] 81.6 kg (02/16 1402)     Height: 5\' 7"  (170.2 cm) Weight: 81.6 kg BMI (Calculated): 28.17   INTAKE/OUTPUT:  This shift: No intake/output data recorded.  Last 2 shifts: @IOLAST2SHIFTS @   PHYSICAL EXAM:  Constitutional:  -- Normal body habitus  -- Awake, alert, and oriented x3  Eyes:  -- Pupils equally round and reactive to light  -- No scleral icterus  Ear, nose, and throat:  -- No jugular venous distension  Pulmonary:  -- No crackles  -- Equal breath sounds bilaterally -- Breathing non-labored at rest Cardiovascular:  -- S1, S2 present  --  No pericardial rubs Gastrointestinal:  -- Abdomen soft, nontender, non-distended, no guarding or rebound tenderness -- No abdominal masses appreciated, pulsatile or otherwise  Musculoskeletal and Integumentary:  -- Wounds: None appreciated -- Extremities: B/L UE and LE FROM, hands and feet warm, no edema  Neurologic:  -- Motor function: intact and symmetric -- Sensation: intact and symmetric   Labs:  CBC Latest Ref Rng & Units 04/30/2021 04/29/2021 04/23/2021  WBC 4.0 - 10.5 K/uL 7.8 8.8 8.8  Hemoglobin 12.0 - 15.0 g/dL 13.2 14.4 14.6  Hematocrit 36.0 - 46.0 % 38.5 43.2 43.4  Platelets 150 - 400 K/uL 257 321 365   CMP Latest Ref Rng & Units 04/30/2021 04/29/2021 04/23/2021  Glucose 70 - 99 mg/dL 84 96 118(H)  BUN 6 - 20 mg/dL 10 12 15   Creatinine 0.44 - 1.00 mg/dL 0.52 0.63 0.59  Sodium 135 - 145 mmol/L 137 137 135  Potassium 3.5 - 5.1 mmol/L 4.0 3.9 3.6  Chloride 98 - 111 mmol/L 109 106 102  CO2 22 - 32 mmol/L 21(L) 23 25  Calcium 8.9 - 10.3 mg/dL 8.7(L) 9.3 9.1  Total Protein 6.5 - 8.1 g/dL - 8.1 7.9  Total  Bilirubin 0.3 - 1.2 mg/dL - 0.5 0.3  Alkaline Phos 38 - 126 U/L - 63 72  AST 15 - 41 U/L - 15 15  ALT 0 - 44 U/L - 11 12    Imaging studies:  EXAM: ULTRASOUND ABDOMEN LIMITED RIGHT UPPER QUADRANT   COMPARISON:  February 28, 2012.   FINDINGS: Gallbladder:   No gallstones or wall thickening visualized. No sonographic Murphy sign noted by sonographer.   Common bile duct:   Diameter: 5 mm which is within normal limits.   Liver:   No focal lesion identified. Within normal limits in parenchymal echogenicity. Portal vein is patent on color Doppler imaging with normal direction of blood flow towards the liver.   Other: None.   IMPRESSION: No definite abnormality seen in the right upper quadrant of the abdomen.     Electronically Signed   By: Marijo Conception M.D.   On: 04/23/2021 08:26  EXAM: CT ABDOMEN AND PELVIS WITH CONTRAST   TECHNIQUE: Multidetector CT imaging of the abdomen and pelvis was performed using the standard protocol following bolus administration of intravenous contrast.   RADIATION DOSE REDUCTION: This exam was performed according to the departmental dose-optimization program which includes automated exposure control, adjustment of the mA and/or kV according to patient size and/or use of iterative reconstruction technique.   CONTRAST:  186mL OMNIPAQUE IOHEXOL 300 MG/ML  SOLN   COMPARISON:  Right upper quadrant ultrasound dated April 23, 2021. CT abdomen pelvis dated October 20, 2015.   FINDINGS: Lower chest: No acute abnormality.   Hepatobiliary: No focal liver abnormality is seen. No gallstones, gallbladder wall thickening, or biliary dilatation.   Pancreas: Unremarkable. No pancreatic ductal dilatation or surrounding inflammatory changes.   Spleen: Normal in size without focal abnormality.   Adrenals/Urinary Tract: Adrenal glands are unremarkable. Kidneys are normal, without renal calculi, focal lesion, or hydronephrosis. Bladder is  unremarkable.   Stomach/Bowel: Stomach is within normal limits. Appendix appears normal. No evidence of bowel wall thickening, distention, or inflammatory changes. Mildly increased stool burden in the right colon and rectum.   Vascular/Lymphatic: No significant vascular findings are present. No enlarged abdominal or pelvic lymph nodes.   Reproductive: Uterus and bilateral adnexa are unremarkable.   Other: No abdominal wall hernia or abnormality. No abdominopelvic ascites. No pneumoperitoneum.  Musculoskeletal: No acute or significant osseous findings.   IMPRESSION: 1. No acute intra-abdominal process. 2. Mildly increased stool burden in the right colon and rectum, consistent with reported history of constipation.     Electronically Signed   By: Titus Dubin M.D.   On: 04/29/2021 15:59  EXAM: NUCLEAR MEDICINE HEPATOBILIARY IMAGING WITH GALLBLADDER EF   TECHNIQUE: Sequential images of the abdomen were obtained out to 60 minutes following intravenous administration of radiopharmaceutical. After oral ingestion of Ensure, gallbladder ejection fraction was determined. At 60 min, normal ejection fraction is greater than 33%.   RADIOPHARMACEUTICALS:  5.05 mCi Tc-73m  Choletec IV   COMPARISON:  April 23, 2021.   FINDINGS: Prompt uptake and biliary excretion of activity by the liver is seen. Gallbladder activity is visualized, consistent with patency of cystic duct. Biliary activity passes into small bowel, consistent with patent common bile duct.   Calculated gallbladder ejection fraction is 41%. (Normal gallbladder ejection fraction with Ensure is greater than 33%.) Patient experienced abdominal pain after drinking Ensure.   IMPRESSION: Normal gallbladder ejection fraction is noted after Ensure administration. Patient did report pain after drinking Ensure.     Electronically Signed   By: Marijo Conception M.D.   On: 04/30/2021 13:13  Assessment/Plan:  26 y.o.  female with upper abdominal, right upper quadrant pain.  Patient has been complaining of upper abdominal/right upper quadrant pain for more than a month.  Initially it was thought to be biliary related but abdominal ultrasound, CT scan of the abdomen and pelvis and HIDA scan has been negative for biliary etiologies.  I again had a long discussion with the patient that the fact that the abdominal ultrasound shows no gallstones or sludge, normal common bile duct diameter, and the CT scan has no intra-abdominal pathology other than constipation and a HIDA scan had a normal ejection fraction, it is less likely that her pain will improve with cholecystectomy.  Recommend further work-up for right upper quadrant pain for other possible differential etiologies such as gastritis, peptic ulcer disease, constipation, viral or inflammatory enteritis, among others.  I discussed with the patient that if everything else is also rule out I will consider doing cholecystectomy but understanding that she might continue with the pain after that her gallbladder is removed if there is still unknown etiology of her right upper quadrant pain.  No surgical indication at this moment.  I will continue to follow peripherally and available for any help.  Arnold Long, MD

## 2021-05-01 DIAGNOSIS — E440 Moderate protein-calorie malnutrition: Secondary | ICD-10-CM | POA: Insufficient documentation

## 2021-05-01 DIAGNOSIS — R1010 Upper abdominal pain, unspecified: Secondary | ICD-10-CM

## 2021-05-01 DIAGNOSIS — R1011 Right upper quadrant pain: Secondary | ICD-10-CM | POA: Diagnosis not present

## 2021-05-01 SURGERY — ESOPHAGOGASTRODUODENOSCOPY (EGD) WITH PROPOFOL
Anesthesia: General

## 2021-05-01 MED ORDER — POLYETHYLENE GLYCOL 3350 17 G PO PACK: 17.0000 g | PACK | Freq: Every day | ORAL | 0 refills | Status: AC

## 2021-05-01 NOTE — Progress Notes (Incomplete)
PROGRESS NOTE  Breanna Carrillo    DOB: 09-23-1995, 26 y.o.  KPT:[id number]    Code Status: Full Code   DOA: 04/29/2021   LOS: 0   Brief hospital course  Breanna Carrillo is a 26 y.o. female with a PMH significant for asthma. They presented from home to the ED on 04/29/2021 with RUQ pain x ~14 days.  She was seen in the ED 2/10 and evaluated for gallbladder disease but workup was negative so was discharged after pain was managed. She was set up for outpatient follow up with general surgery but had not had an appointment yet. Patient presents to ED again this admission describing unbearable pain in RUQ. In the ED, it was found that they had no acute findings on abdominal CT and mostly unremarkable labs.  They were treated with analgesia and antiemetics. General surgery and GI were both consulted for further evaluation and management. Patient was admitted to medicine service for further workup and management of care as outlined in detail below.  05/01/21 -stable  Assessment & Plan  Principal Problem:   Abdominal pain  RUQ pain- for about 2 weeks. differential includes gallbladder disease, constipation, acute viral infection. No gallstones or cholecystitis seen on recent RUQ [place]. No acute findings to explain pain on CT this presentation. CMP is unremarkable including transaminases.  - general surgery following, appreciate recs             - HIDA scan today                         - patient currently NPO and will not receive opioids prior to scan. - acute hepatitis panel pending - GI consulted             - planning EGD if HIDA scan negative - anti-emetics and analgesia PRN - CMP am  Body mass index is 28.18 kg/m.  VTE ppx: heparin injection 5,000 Units Start: 04/29/21 2200   Diet:     Diet   Diet regular Room service appropriate? Yes; Fluid consistency: Thin   Subjective 05/01/21    Pt reports continued RUQ abdominal pain and nausea which is well controlled with zofran.    Objective   Vitals:   04/30/21 1511 04/30/21 2105 05/01/21 0501 05/01/21 0746  BP: 103/60 113/64 109/67 116/87  Pulse: (!) 106 84 90 (!) 106  Resp: 20 18 17 18   Temp: 98.7 F (37.1 C) 98.3 F (36.8 C) 98.2 F (36.8 C) 97.6 F (36.4 C)  TempSrc: Oral  Oral Oral  SpO2: 99% 100% 98% 95%  Weight:      Height:       No intake or output data in the 24 hours ending 05/01/21 0748 Filed Weights   04/29/21 1402  Weight: 81.6 kg     Physical Exam:  General: awake, alert, NAD HEENT: atraumatic, clear conjunctiva, anicteric sclera, MMM, hearing grossly normal Respiratory: normal respiratory effort. Cardiovascular: normal S1/S2, RRR, no JVD, murmurs, quick capillary refill  Gastrointestinal: soft, ND. Mildly tender to palpation upper quadrants. Nervous: A&O x3. no gross focal neurologic deficits, normal speech Extremities: moves all equally, no edema, normal tone Skin: dry, intact, normal temperature, normal color. No rashes, lesions or ulcers on exposed skin Psychiatry: normal mood, congruent affect  Labs   I have personally reviewed the following labs and imaging studies CBC    Component Value Date/Time   WBC 7.8 04/30/2021 0520   RBC 4.29 04/30/2021 0520  HGB 13.2 04/30/2021 0520   HGB 10.0 (L) 12/22/2016 0913   HCT 38.5 04/30/2021 0520   HCT 31.7 (L) 12/22/2016 0913   PLT 257 04/30/2021 0520   PLT 247 12/22/2016 0913   MCV 89.7 04/30/2021 0520   MCV 81 12/22/2016 0913   MCV 89 10/16/2013 1734   MCH 30.8 04/30/2021 0520   MCHC 34.3 04/30/2021 0520   RDW 12.5 04/30/2021 0520   RDW 14.4 12/22/2016 0913   RDW 13.3 10/16/2013 1734   LYMPHSABS 4.0 03/01/2019 0414   LYMPHSABS 1.9 07/27/2016 1052   LYMPHSABS 3.1 02/24/2013 2321   MONOABS 0.7 03/01/2019 0414   MONOABS 1.2 (H) 02/24/2013 2321   EOSABS 0.1 03/01/2019 0414   EOSABS 0.1 07/27/2016 1052   EOSABS 0.1 02/24/2013 2321   BASOSABS 0.1 03/01/2019 0414   BASOSABS 0.0 07/27/2016 1052   BASOSABS 0.1 02/24/2013 2321    BMP Latest Ref Rng & Units 04/30/2021 04/29/2021 04/23/2021  Glucose 70 - 99 mg/dL 84 96 619(J)  BUN 6 - 20 mg/dL 10 12 15   Creatinine 0.44 - 1.00 mg/dL 0.93 2.67 1.24  BUN/Creat Ratio 9 - 23 - - -  Sodium 135 - 145 mmol/L 137 137 135  Potassium 3.5 - 5.1 mmol/L 4.0 3.9 3.6  Chloride 98 - 111 mmol/L 109 106 102  CO2 22 - 32 mmol/L 21(L) 23 25  Calcium 8.9 - 10.3 mg/dL 5.8(K) 9.3 9.1    CT ABDOMEN PELVIS W CONTRAST  Result Date: 04/29/2021 CLINICAL DATA:  Abdominal pain with nausea and vomiting. EXAM: CT ABDOMEN AND PELVIS WITH CONTRAST TECHNIQUE: Multidetector CT imaging of the abdomen and pelvis was performed using the standard protocol following bolus administration of intravenous contrast. RADIATION DOSE REDUCTION: This exam was performed according to the departmental dose-optimization program which includes automated exposure control, adjustment of the mA and/or kV according to patient size and/or use of iterative reconstruction technique. CONTRAST:  OMNIPAQUE IOHEXOL 300 MG/ML  SOLN COMPARISON:  Right upper quadrant ultrasound dated April 23, 2021. CT abdomen pelvis dated October 20, 2015. FINDINGS: Lower chest: No acute abnormality. Hepatobiliary: No focal liver abnormality is seen. No gallstones, gallbladder wall thickening, or biliary dilatation. Pancreas: Unremarkable. No pancreatic ductal dilatation or surrounding inflammatory changes. Spleen: Normal in size without focal abnormality. Adrenals/Urinary Tract: Adrenal glands are unremarkable. Kidneys are normal, without renal calculi, focal lesion, or hydronephrosis. Bladder is unremarkable. Stomach/Bowel: Stomach is within normal limits. Appendix appears normal. No evidence of bowel wall thickening, distention, or inflammatory changes. Mildly increased stool burden in the right colon and rectum. Vascular/Lymphatic: No significant vascular findings are present. No enlarged abdominal or pelvic lymph nodes. Reproductive: Uterus and  bilateral adnexa are unremarkable. Other: No abdominal wall hernia or abnormality. No abdominopelvic ascites. No pneumoperitoneum. Musculoskeletal: No acute or significant osseous findings. IMPRESSION: 1. No acute intra-abdominal process. 2. Mildly increased stool burden in the right colon and rectum, consistent with reported history of constipation. Electronically Signed   By: Obie Dredge M.D.   On: 04/29/2021 15:59   NM Hepato W/EF  Result Date: 04/30/2021 CLINICAL DATA:  Right upper quadrant abdominal pain. EXAM: NUCLEAR MEDICINE HEPATOBILIARY IMAGING WITH GALLBLADDER EF TECHNIQUE: Sequential images of the abdomen were obtained out to 60 minutes following intravenous administration of radiopharmaceutical. After oral ingestion of Ensure, gallbladder ejection fraction was determined. At 60 min, normal ejection fraction is greater than 33%. RADIOPHARMACEUTICALS:  5.05 mCi Tc-52m  Choletec IV COMPARISON:  April 23, 2021. FINDINGS: Prompt uptake and biliary excretion of activity  by the liver is seen. Gallbladder activity is visualized, consistent with patency of cystic duct. Biliary activity passes into small bowel, consistent with patent common bile duct. Calculated gallbladder ejection fraction is 41%. (Normal gallbladder ejection fraction with Ensure is greater than 33%.) Patient experienced abdominal pain after drinking Ensure. IMPRESSION: Normal gallbladder ejection fraction is noted after Ensure administration. Patient did report pain after drinking Ensure. Electronically Signed   By: Lupita Raider M.D.   On: 04/30/2021 13:13    Disposition Plan & Communication  Patient status: Observation  Admitted From: Home Planned disposition location: Home Anticipated discharge date: 2/19 pending completing workup from general surgery and GI  Family Communication: none    Author: Leeroy Bock, DO [facility] 05/01/2021, 7:48 AM   Available by Epic secure chat 7AM-7PM. If 7PM-7AM,  please contact night-coverage.  [facility] contact information found on [email or web address].

## 2021-05-01 NOTE — Discharge Summary (Signed)
Physician Discharge Summary  Patient: Breanna Carrillo LZJ:[id number] DOB: 03-13-1996   Code Status: Full Code Admit date: 04/29/2021 Discharge date: 05/01/2021 Disposition: Home, No home health services recommended PCP: [facility]  Recommendations for Outpatient Follow-up:  Follow up with PCP within 1-2 weeks.  Would recommend increasing constipation treatment.   Discharge Diagnoses:  Principal Problem:   Abdominal pain Active Problems:   Malnutrition of moderate degree  Brief Hospital Course Summary: Breanna Carrillo is a 26 y.o. female with a PMH significant for asthma. They presented from home to the ED on 04/29/2021 with RUQ pain x ~14 days.   She was also seen in the ED 2/10 for same reason and was evaluated for gallbladder disease including RUQ [place] but workup was negative so was discharged after pain was managed. She was set up for outpatient follow up with general surgery and a HIDA scan but had not had an appointment yet.  Patient presents to ED again this admission describing unbearable pain in RUQ. In the ED, it was found that they had no acute findings on abdominal CT other than mild constipation to contribute to her symptoms and unremarkable labs. Vital signs were stable. They were treated with analgesia and antiemetics.   General surgery and GI were both consulted for further evaluation and management. HIDA scan was completed and was completely normal. Patient was informed and general surgery discussed with patient that gallbladder disease is unlikely and recommended against cholecystectomy.   At that point, GI was willing to pursue EGD to evaluate for ulcer or other etiology but patient refused further workup as she is insistent that the cause of her pain is her gallbladder and will need it taken out.  Her pain is post-prandial of RUQ and reproducible when pressing on her right ribs. She denies a BM for several days. Improved with zofran and a heating pad.   Patient displayed agitation that she would not get a cholecystectomy at this time and decided to leave the hospital to seek further care at [facility].    Discharge Condition: Stable, improved Recommended discharge diet: Regular healthy diet  Consultations: General surgery GI  Procedures/Studies: HIDA scan Patient declined EGD   Allergies as of 05/01/2021   No Known Allergies      Medication List     STOP taking these medications    cyclobenzaprine 5 MG tablet Commonly known as: FLEXERIL   guaiFENesin-codeine 100-10 MG/5ML syrup   prenatal multivitamin Tabs tablet   senna-docusate 8.6-50 MG tablet Commonly known as: Senokot-S   sertraline 25 MG tablet Commonly known as: Zoloft   sertraline 50 MG tablet Commonly known as: Zoloft   venlafaxine XR 37.5 MG 24 hr capsule Commonly known as: EFFEXOR-XR       TAKE these medications    ibuprofen 800 MG tablet Commonly known as: ADVIL Take 1 tablet (800 mg total) by mouth every 8 (eight) hours as needed.   ondansetron 4 MG tablet Commonly known as: ZOFRAN Take 4 mg by mouth every 8 (eight) hours as needed.   pantoprazole 40 MG tablet Commonly known as: Protonix Take 1 tablet (40 mg total) by mouth daily.   polyethylene glycol 17 g packet Commonly known as: MIRALAX / GLYCOLAX Take 17 g by mouth daily. Start taking on: May 02, 2021   sucralfate 1 g tablet Commonly known as: Carafate Take 1 tablet (1 g total) by mouth 4 (four) times daily -  with meals and at bedtime for 15  days.        Subjective   Pt reports that she continues to have RUQ pain only controlled with zofran and heating pad. Denies BM in several days. She continues to tell staff including myself that she is getting a cholecystectomy but that is not consistent with what surgery has told her and I have also reiterated that cholecystectomy is not recommended at this time. She declines EGD as she is convinced that her gallbladder is the cause of  her pain and wants to pursue second opinion at [facility]. She states that she is going to leave the hospital and go directly there.  Objective  Blood pressure 116/87, pulse (!) 106, temperature 97.6 F (36.4 C), temperature source Oral, resp. rate 18, height 5\' 7"  (1.702 m), weight 81.6 kg, SpO2 95 %, not currently breastfeeding.   General: Pt is alert, awake, not in acute distress Cardiovascular: extremities well perfused Respiratory: normal respiratory effort Abdominal: Soft, NT, ND. Tenderness over right rib cage under breast tissue. No skin changes. Extremities: no edema, no cyanosis   The results of significant diagnostics from this hospitalization (including imaging, microbiology, ancillary and laboratory) are listed below for reference.   Imaging studies: CT ABDOMEN PELVIS W CONTRAST  Result Date: 04/29/2021 CLINICAL DATA:  Abdominal pain with nausea and vomiting. EXAM: CT ABDOMEN AND PELVIS WITH CONTRAST TECHNIQUE: Multidetector CT imaging of the abdomen and pelvis was performed using the standard protocol following bolus administration of intravenous contrast. RADIATION DOSE REDUCTION: This exam was performed according to the departmental dose-optimization program which includes automated exposure control, adjustment of the mA and/or kV according to patient size and/or use of iterative reconstruction technique. CONTRAST:  05/01/2021 OMNIPAQUE IOHEXOL 300 MG/ML  SOLN COMPARISON:  Right upper quadrant ultrasound dated April 23, 2021. CT abdomen pelvis dated October 20, 2015. FINDINGS: Lower chest: No acute abnormality. Hepatobiliary: No focal liver abnormality is seen. No gallstones, gallbladder wall thickening, or biliary dilatation. Pancreas: Unremarkable. No pancreatic ductal dilatation or surrounding inflammatory changes. Spleen: Normal in size without focal abnormality. Adrenals/Urinary Tract: Adrenal glands are unremarkable. Kidneys are normal, without renal calculi, focal lesion, or  hydronephrosis. Bladder is unremarkable. Stomach/Bowel: Stomach is within normal limits. Appendix appears normal. No evidence of bowel wall thickening, distention, or inflammatory changes. Mildly increased stool burden in the right colon and rectum. Vascular/Lymphatic: No significant vascular findings are present. No enlarged abdominal or pelvic lymph nodes. Reproductive: Uterus and bilateral adnexa are unremarkable. Other: No abdominal wall hernia or abnormality. No abdominopelvic ascites. No pneumoperitoneum. Musculoskeletal: No acute or significant osseous findings. IMPRESSION: 1. No acute intra-abdominal process. 2. Mildly increased stool burden in the right colon and rectum, consistent with reported history of constipation. Electronically Signed   By: October 22, 2015 M.D.   On: 04/29/2021 15:59   NM Hepato W/EF  Result Date: 04/30/2021 CLINICAL DATA:  Right upper quadrant abdominal pain. EXAM: NUCLEAR MEDICINE HEPATOBILIARY IMAGING WITH GALLBLADDER EF TECHNIQUE: Sequential images of the abdomen were obtained out to 60 minutes following intravenous administration of radiopharmaceutical. After oral ingestion of Ensure, gallbladder ejection fraction was determined. At 60 min, normal ejection fraction is greater than 33%. RADIOPHARMACEUTICALS:  5.05 mCi Tc-28m  Choletec IV COMPARISON:  April 23, 2021. FINDINGS: Prompt uptake and biliary excretion of activity by the liver is seen. Gallbladder activity is visualized, consistent with patency of cystic duct. Biliary activity passes into small bowel, consistent with patent common bile duct. Calculated gallbladder ejection fraction is 41%. (Normal gallbladder ejection fraction with Ensure is greater  than 33%.) Patient experienced abdominal pain after drinking Ensure. IMPRESSION: Normal gallbladder ejection fraction is noted after Ensure administration. Patient did report pain after drinking Ensure. Electronically Signed   By: Lupita Raider M.D.   On: 04/30/2021  13:13   US Abdomen Limited RUQ (LIVER/GB)  Result Date: 04/23/2021 CLINICAL DATA:  Upper abdominal pain. EXAM: ULTRASOUND ABDOMEN LIMITED RIGHT UPPER QUADRANT COMPARISON:  February 28, 2012. FINDINGS: Gallbladder: No gallstones or wall thickening visualized. No sonographic Murphy sign noted by sonographer. Common bile duct: Diameter: 5 mm which is within normal limits. Liver: No focal lesion identified. Within normal limits in parenchymal echogenicity. Portal vein is patent on color Doppler imaging with normal direction of blood flow towards the liver. Other: None. IMPRESSION: No definite abnormality seen in the right upper quadrant of the abdomen. Electronically Signed   By: Lupita Raider M.D.   On: 04/23/2021 08:26    Labs: Basic Metabolic Panel: Recent Labs  Lab 04/29/21 1258 04/30/21 0520  NA 137 137  K 3.9 4.0  CL 106 109  CO2 23 21*  GLUCOSE 96 84  BUN 12 10  CREATININE 0.63 0.52  CALCIUM 9.3 8.7*   CBC: Recent Labs  Lab 04/29/21 1258 04/30/21 0520  WBC 8.8 7.8  HGB 14.4 13.2  HCT 43.2 38.5  MCV 89.1 89.7  PLT 321 257   Microbiology: none  Time coordinating discharge: Over 30 minutes  Leeroy Bock, MD  [facility] 05/01/2021, 11:32 AM

## 2021-05-01 NOTE — Progress Notes (Signed)
Patient ID: Breanna Carrillo, female   DOB: [birth year], 26 y.o.   MRN: [id number]     [facility] Day(s): 0.   Interval History: Patient seen and examined, no acute events or new complaints overnight. Patient reports continued having the same pain no improvement.  Patient also continue reporting no bowel movement since a week ago.  She has tried MiraLAX and milk of magnesia without any bowel movement.  Vital signs in last 24 hours: [min-max] current  Temp:  [97.6 F (36.4 C)-98.7 F (37.1 C)] 97.6 F (36.4 C) (02/18 0746) Pulse Rate:  [84-106] 106 (02/18 0746) Resp:  [17-20] 18 (02/18 0746) BP: (103-116)/(60-87) 116/87 (02/18 0746) SpO2:  [95 %-100 %] 95 % (02/18 0746)     Height: 5\' 7"  (170.2 cm) Weight: 81.6 kg BMI (Calculated): 28.17   Physical Exam:  Constitutional: alert, cooperative and no distress  Respiratory: breathing non-labored at rest  Cardiovascular: regular rate and sinus rhythm  Gastrointestinal: soft, non-tender, and non-distended  Labs:  CBC Latest Ref Rng & Units 04/30/2021 04/29/2021 04/23/2021  WBC 4.0 - 10.5 K/uL 7.8 8.8 8.8  Hemoglobin 12.0 - 15.0 g/dL 13.2 14.4 14.6  Hematocrit 36.0 - 46.0 % 38.5 43.2 43.4  Platelets 150 - 400 K/uL 257 321 365   CMP Latest Ref Rng & Units 04/30/2021 04/29/2021 04/23/2021  Glucose 70 - 99 mg/dL 84 96 118(H)  BUN 6 - 20 mg/dL 10 12 15   Creatinine 0.44 - 1.00 mg/dL 0.52 0.63 0.59  Sodium 135 - 145 mmol/L 137 137 135  Potassium 3.5 - 5.1 mmol/L 4.0 3.9 3.6  Chloride 98 - 111 mmol/L 109 106 102  CO2 22 - 32 mmol/L 21(L) 23 25  Calcium 8.9 - 10.3 mg/dL 8.7(L) 9.3 9.1  Total Protein 6.5 - 8.1 g/dL - 8.1 7.9  Total Bilirubin 0.3 - 1.2 mg/dL - 0.5 0.3  Alkaline Phos 38 - 126 U/L - 63 72  AST 15 - 41 U/L - 15 15  ALT 0 - 44 U/L - 11 12    Imaging studies: I personally evaluated the images of the HIDA scan.  Normal atrial fraction.  Patent cystic duct.   Assessment/Plan:  26 y.o. female with upper  abdominal, right upper quadrant pain.   -Abdominal ultrasound done on 04/23/2021 shows no gallstone or sludge.  Common bile duct within normal limits -CT scan of the abdomen and pelvis done on 04/29/2021 shows no intra-abdominal pathology other than constipation -HIDA scan done yesterday shows patent cystic duct with ejection fraction of 41%  Biliary work-up has been negative so far.  Pain less likely coming from gallbladder.  Patient will complain now is constipation-she has not gone to the bathroom for at least a week.  She has tried MiraLAX and milk of magnesia without any bowel movement.  She also endorses having rectal bleeding.  I will defer management of constipation and any further work-up to GI and hospitalist.  Patient's mother was present at bedside.  I discussed with them that I do not recommend cholecystectomy at this moment until complete work-up is done.  If complete work-up shows no other cause of her pain I can discuss with her the possibility of cholecystectomy but she has to understand that she can remain with the same pain since nothing point the gallbladder as a cause of the pain.  She continues to insist that multiple family member has cholecystectomy done before with improvement of pain.  She specifically says that her  mom had her gallbladder ruptured without having stones.  I discussed with her that most likely she did have stones but she never had any symptoms before and she presented to the ED one day with severe pain and cholecystitis.  In the current situation patient has no stones or sludge and the chances of cholecystitis is very low.  This encounter was 30 minutes most of the time counseling the patient and coordinating plan of care.  Breanna Long, MD

## 2021-05-01 NOTE — Discharge Instructions (Signed)
Your workup is incomplete as you are choosing to leave the hospital without the recommended evaluations.  You received a very extensive evaluation of your gallbladder which was completely normal and is not the source of your pain.  Please follow up with your primary care doctor if you need further recommendations

## 2021-05-01 NOTE — Consult Note (Signed)
Wyline Mood , MD [address], [place], [place], [address] [address], [address], [place], [place], [address] Phone: [phone]  Fax: [phone]  Consultation  Referring Provider:     Dr Dareen Piano Primary Care Physician:  [facility] Primary Gastroenterologist:  Dr. Mia Creek         Reason for Consultation:     Abdominal pain   Date of Admission:  04/29/2021 Date of Consultation:  05/01/2021         HPI:   Breanna Carrillo is a 26 y.o. female presented to the emergency room on 04/29/2021 with abdominal pain.  She presented to the ER at that point of time with right upper quadrant pain for 14 days.  Discharged home after work-up for gallbladder disease was negative.  She went home the pain was significant and came back to the ER again.  Complain of constipation and some blood on the stool.  HIDA scan was normal, CT abdomen and pelvis on 04/29/2021 showed mildly increased stool burden in the right colon and rectal.  Right upper quadrant ultrasound on 04/23/2021 showed a common bile duct of 5 mm diameter but no other abnormalities.  Lab work on 04/29/2021 showed no elevation of liver function test.  Lipase was 39.  Hemoglobin was 14.4 g.  Urinalysis shows no evidence of infection.  Pregnancy test was negative.  BMP performed yesterday showed no abnormalities.  She has also been seen by general surgery.  Did not feel it was related to her gallbladder.  The pain has persisted despite 2 days of inpatient therapy.  I have been consulted to determine if she would benefit from an EGD.  She says she has had RUQ pain for a few weeks if not longer, lost weight , sharp in nature, worse as soon as she eats, non radiating , no clear relieving factors except a hot pad. No nsaid use. No marijuana use. Having a bowel movement too doesn't help. Has issues with constipation.   Past Medical History:  Diagnosis Date   Asthma    Pregnancy induced hypertension     Past Surgical  History:  Procedure Laterality Date   JOINT REPLACEMENT     R elbow    Prior to Admission medications   Medication Sig Start Date End Date Taking? Authorizing Provider  ondansetron (ZOFRAN) 4 MG tablet Take 4 mg by mouth every 8 (eight) hours as needed. 04/21/21 05/01/21 Yes [provider]  pantoprazole (PROTONIX) 40 MG tablet Take 1 tablet (40 mg total) by mouth daily. 04/23/21 04/23/22 Yes Jene Every, MD  sucralfate (CARAFATE) 1 g tablet Take 1 tablet (1 g total) by mouth 4 (four) times daily -  with meals and at bedtime for 15 days. 04/23/21 05/08/21 Yes Jene Every, MD  cyclobenzaprine (FLEXERIL) 5 MG tablet Take 1 tablet (5 mg total) by mouth 3 (three) times daily as needed. Patient not taking: Reported on 04/23/2021 01/22/20   Menshew, Charlesetta Ivory, PA-C  guaiFENesin-codeine 100-10 MG/5ML syrup Take 5 mLs by mouth every 6 (six) hours as needed for cough. Patient not taking: Reported on 04/23/2021 03/04/20   Tommi Rumps, PA-C  ibuprofen (ADVIL) 800 MG tablet Take 1 tablet (800 mg total) by mouth every 8 (eight) hours as needed. Patient not taking: Reported on 04/23/2021 01/22/20   Menshew, Charlesetta Ivory, PA-C  Prenatal Vit-Fe Fumarate-FA (PRENATAL MULTIVITAMIN) TABS tablet Take 1 tablet by mouth daily at 12 noon. Patient not taking: Reported  on 04/23/2021    [provider]  senna-docusate (SENOKOT-S) 8.6-50 MG tablet Take 2 tablets by mouth daily. Patient not taking: Reported on 04/23/2021 01/15/17   Minott, Ebony Cargo, MD  sertraline (ZOLOFT) 25 MG tablet Take 1 tablet (25 mg total) daily by mouth. Patient not taking: Reported on 04/23/2021 01/30/17   Lavonia Drafts, MD  sertraline (ZOLOFT) 50 MG tablet Take 1 tablet (50 mg total) daily by mouth. Patient not taking: Reported on 04/23/2021 01/30/17   Lavonia Drafts, MD  venlafaxine XR (EFFEXOR-XR) 37.5 MG 24 hr capsule Take 37.5 mg by mouth daily. Patient not taking: Reported on 04/23/2021 02/23/18    [provider]    Family History  Problem Relation Age of Onset   Hypertension Father    Diabetes Father    Migraines Father    Diabetes Maternal Grandmother    Hypertension Maternal Grandmother    Stroke Maternal Grandmother    Heart disease Maternal Grandmother    Hypertension Paternal Grandmother    Cirrhosis Maternal Grandfather    Cancer Paternal Grandfather        bone marrow     Social History   Tobacco Use   Smoking status: Never   Smokeless tobacco: Never  Vaping Use   Vaping Use: Never used  Substance Use Topics   Alcohol use: No   Drug use: No    Allergies as of 04/29/2021   (No Known Allergies)    Review of Systems:    All systems reviewed and negative except where noted in HPI.   Physical Exam:  Vital signs in last 24 hours: Temp:  [97.6 F (36.4 C)-98.7 F (37.1 C)] 97.6 F (36.4 C) (02/18 0746) Pulse Rate:  [84-106] 106 (02/18 0746) Resp:  [17-20] 18 (02/18 0746) BP: (103-116)/(60-87) 116/87 (02/18 0746) SpO2:  [95 %-100 %] 95 % (02/18 0746) Last BM Date : 04/23/21 General:   Pleasant, cooperative in NAD Head:  Normocephalic and atraumatic. Eyes:   No icterus.   Conjunctiva pink. PERRLA. Ears:  Normal auditory acuity. Neck:  Supple; no masses or thyroidomegaly  Abdomen:  Soft, RUQ tenderness over RT lower ribs in anterior axillary line replicating the pain. nontender. Normal bowel sounds. No appreciable masses or hepatomegaly.  No rebound or guarding.  Neurologic:  Alert and oriented x3;  grossly normal neurologically. Skin:  Intact without significant lesions or rashes. Cervical Nodes:  No significant cervical adenopathy. Psych:  Alert and cooperative. Normal affect.  LAB RESULTS: Recent Labs    04/29/21 1258 04/30/21 0520  WBC 8.8 7.8  HGB 14.4 13.2  HCT 43.2 38.5  PLT 321 257   BMET Recent Labs    04/29/21 1258 04/30/21 0520  NA 137 137  K 3.9 4.0  CL 106 109  CO2 23 21*  GLUCOSE 96 84  BUN 12 10  CREATININE  0.63 0.52  CALCIUM 9.3 8.7*   LFT Recent Labs    04/29/21 1258  PROT 8.1  ALBUMIN 4.4  AST 15  ALT 11  ALKPHOS 63  BILITOT 0.5   PT/INR No results for input(s): LABPROT, INR in the last 72 hours.  STUDIES: CT ABDOMEN PELVIS W CONTRAST  Result Date: 04/29/2021 CLINICAL DATA:  Abdominal pain with nausea and vomiting. EXAM: CT ABDOMEN AND PELVIS WITH CONTRAST TECHNIQUE: Multidetector CT imaging of the abdomen and pelvis was performed using the standard protocol following bolus administration of intravenous contrast. RADIATION DOSE REDUCTION: This exam was performed according to the departmental dose-optimization program which includes  automated exposure control, adjustment of the mA and/or kV according to patient size and/or use of iterative reconstruction technique. CONTRAST:  12mL OMNIPAQUE IOHEXOL 300 MG/ML  SOLN COMPARISON:  Right upper quadrant ultrasound dated April 23, 2021. CT abdomen pelvis dated October 20, 2015. FINDINGS: Lower chest: No acute abnormality. Hepatobiliary: No focal liver abnormality is seen. No gallstones, gallbladder wall thickening, or biliary dilatation. Pancreas: Unremarkable. No pancreatic ductal dilatation or surrounding inflammatory changes. Spleen: Normal in size without focal abnormality. Adrenals/Urinary Tract: Adrenal glands are unremarkable. Kidneys are normal, without renal calculi, focal lesion, or hydronephrosis. Bladder is unremarkable. Stomach/Bowel: Stomach is within normal limits. Appendix appears normal. No evidence of bowel wall thickening, distention, or inflammatory changes. Mildly increased stool burden in the right colon and rectum. Vascular/Lymphatic: No significant vascular findings are present. No enlarged abdominal or pelvic lymph nodes. Reproductive: Uterus and bilateral adnexa are unremarkable. Other: No abdominal wall hernia or abnormality. No abdominopelvic ascites. No pneumoperitoneum. Musculoskeletal: No acute or significant osseous  findings. IMPRESSION: 1. No acute intra-abdominal process. 2. Mildly increased stool burden in the right colon and rectum, consistent with reported history of constipation. Electronically Signed   By: Titus Dubin M.D.   On: 04/29/2021 15:59   NM Hepato W/EF  Result Date: 04/30/2021 CLINICAL DATA:  Right upper quadrant abdominal pain. EXAM: NUCLEAR MEDICINE HEPATOBILIARY IMAGING WITH GALLBLADDER EF TECHNIQUE: Sequential images of the abdomen were obtained out to 60 minutes following intravenous administration of radiopharmaceutical. After oral ingestion of Ensure, gallbladder ejection fraction was determined. At 60 min, normal ejection fraction is greater than 33%. RADIOPHARMACEUTICALS:  5.05 mCi Tc-1m  Choletec IV COMPARISON:  April 23, 2021. FINDINGS: Prompt uptake and biliary excretion of activity by the liver is seen. Gallbladder activity is visualized, consistent with patency of cystic duct. Biliary activity passes into small bowel, consistent with patent common bile duct. Calculated gallbladder ejection fraction is 41%. (Normal gallbladder ejection fraction with Ensure is greater than 33%.) Patient experienced abdominal pain after drinking Ensure. IMPRESSION: Normal gallbladder ejection fraction is noted after Ensure administration. Patient did report pain after drinking Ensure. Electronically Signed   By: Marijo Conception M.D.   On: 04/30/2021 13:13      Impression / Plan:   Breanna Carrillo is a 26 y.o. y/o female with severe right upper quadrant pain bringing her to the hospital on 2 successive days.  Work-up for gallbladder disease including evaluation by general surgery has been negative.  Imaging that includes CT abdomen, HIDA scan, right upper quadrant ultrasound only showed mild constipation.  I have been consulted to determine if she will benefit from the EGD.  Likely musculoskeletal pain/ Tenderness over rt lower ribs in anterior axillary line . Since all work-up is being done it may  not be unreasonable to proceed with an EGD but the patient wants to go to [facility] and refused egd at this time. She was under that she was going to have cholecystectomy but there was no such plan. Advise also daily miralax for constipation   I will sign off.  Please call me if any further GI concerns or questions.  We would like to thank you for the opportunity to participate in the care of ATIKA ROGACKI.   Thank you for involving me in the care of this patient.      LOS: 0 days   Jonathon Bellows, MD  05/01/2021, 8:40 AM

## 2021-05-01 NOTE — Progress Notes (Signed)
Patient nauseated, no emesis at this time. Zofran given as ordered. Anselm Jungling

## 2021-05-03 ENCOUNTER — Ambulatory Visit: Payer: Medicaid Other

## 2021-05-03 NOTE — Telephone Encounter (Signed)
Opened in error

## 2021-05-11 ENCOUNTER — Ambulatory Visit: Payer: Medicaid Other

## 2021-07-14 DIAGNOSIS — Z23 Encounter for immunization: Secondary | ICD-10-CM | POA: Diagnosis not present

## 2021-07-14 DIAGNOSIS — Z0184 Encounter for antibody response examination: Secondary | ICD-10-CM | POA: Diagnosis not present

## 2021-07-14 DIAGNOSIS — Z79899 Other long term (current) drug therapy: Secondary | ICD-10-CM | POA: Diagnosis not present

## 2021-07-14 DIAGNOSIS — Z111 Encounter for screening for respiratory tuberculosis: Secondary | ICD-10-CM | POA: Diagnosis not present

## 2021-07-14 DIAGNOSIS — Z7185 Encounter for immunization safety counseling: Secondary | ICD-10-CM | POA: Diagnosis not present
